# Patient Record
Sex: Female | Born: 1965
Health system: Southern US, Community
[De-identification: ages and names within clinical notes are randomized; demographics above are authoritative.]

## PROBLEM LIST (undated history)

## (undated) DIAGNOSIS — R112 Nausea with vomiting, unspecified: Secondary | ICD-10-CM

## (undated) DIAGNOSIS — Z9889 Other specified postprocedural states: Secondary | ICD-10-CM

## (undated) DIAGNOSIS — D649 Anemia, unspecified: Secondary | ICD-10-CM

## (undated) HISTORY — PX: TONSILLECTOMY: SUR1361

## (undated) HISTORY — PX: ADENOIDECTOMY: SUR15

## (undated) HISTORY — PX: TUBAL LIGATION: SHX77

---

## 2001-06-28 ENCOUNTER — Ambulatory Visit (HOSPITAL_COMMUNITY): Admission: RE | Admit: 2001-06-28 | Discharge: 2001-06-28 | Payer: Self-pay | Admitting: Pulmonary Disease

## 2004-11-11 ENCOUNTER — Ambulatory Visit (HOSPITAL_COMMUNITY): Admission: RE | Admit: 2004-11-11 | Discharge: 2004-11-11 | Payer: Self-pay | Admitting: Specialist

## 2004-11-19 ENCOUNTER — Encounter: Admission: RE | Admit: 2004-11-19 | Discharge: 2004-12-23 | Payer: Self-pay | Admitting: Specialist

## 2006-08-31 ENCOUNTER — Encounter: Admission: RE | Admit: 2006-08-31 | Discharge: 2006-08-31 | Payer: Self-pay | Admitting: Unknown Physician Specialty

## 2007-05-06 ENCOUNTER — Ambulatory Visit (HOSPITAL_COMMUNITY): Admission: RE | Admit: 2007-05-06 | Discharge: 2007-05-06 | Payer: Self-pay | Admitting: Pulmonary Disease

## 2007-09-02 ENCOUNTER — Ambulatory Visit (HOSPITAL_COMMUNITY): Admission: RE | Admit: 2007-09-02 | Discharge: 2007-09-02 | Payer: Self-pay | Admitting: Unknown Physician Specialty

## 2007-10-12 ENCOUNTER — Emergency Department (HOSPITAL_COMMUNITY): Admission: EM | Admit: 2007-10-12 | Discharge: 2007-10-12 | Payer: Self-pay | Admitting: Family Medicine

## 2008-09-18 ENCOUNTER — Ambulatory Visit (HOSPITAL_COMMUNITY): Admission: RE | Admit: 2008-09-18 | Discharge: 2008-09-18 | Payer: Self-pay | Admitting: Unknown Physician Specialty

## 2008-09-28 ENCOUNTER — Emergency Department (HOSPITAL_COMMUNITY): Admission: EM | Admit: 2008-09-28 | Discharge: 2008-09-28 | Payer: Self-pay | Admitting: Emergency Medicine

## 2009-09-06 ENCOUNTER — Other Ambulatory Visit: Admission: RE | Admit: 2009-09-06 | Discharge: 2009-09-06 | Payer: Self-pay | Admitting: Family Medicine

## 2009-11-12 ENCOUNTER — Ambulatory Visit (HOSPITAL_COMMUNITY): Admission: RE | Admit: 2009-11-12 | Discharge: 2009-11-12 | Payer: Self-pay | Admitting: Family Medicine

## 2010-11-29 ENCOUNTER — Encounter: Payer: Self-pay | Admitting: Unknown Physician Specialty

## 2012-03-30 ENCOUNTER — Other Ambulatory Visit (HOSPITAL_COMMUNITY)
Admission: RE | Admit: 2012-03-30 | Discharge: 2012-03-30 | Disposition: A | Discharge status: Home / Self Care | Payer: 59 | Source: Ambulatory Visit | Attending: Family Medicine | Admitting: Family Medicine

## 2012-03-30 DIAGNOSIS — Z124 Encounter for screening for malignant neoplasm of cervix: Secondary | ICD-10-CM | POA: Insufficient documentation

## 2012-03-30 DIAGNOSIS — Z1159 Encounter for screening for other viral diseases: Secondary | ICD-10-CM | POA: Insufficient documentation

## 2012-04-25 ENCOUNTER — Other Ambulatory Visit (HOSPITAL_COMMUNITY): Payer: Self-pay | Admitting: Family Medicine

## 2012-04-25 DIAGNOSIS — Z139 Encounter for screening, unspecified: Secondary | ICD-10-CM

## 2012-05-02 ENCOUNTER — Ambulatory Visit (HOSPITAL_COMMUNITY)
Admission: RE | Admit: 2012-05-02 | Discharge: 2012-05-02 | Disposition: A | Discharge status: Home / Self Care | Payer: 59 | Source: Ambulatory Visit | Attending: Family Medicine | Admitting: Family Medicine

## 2012-05-02 DIAGNOSIS — Z139 Encounter for screening, unspecified: Secondary | ICD-10-CM

## 2012-05-02 DIAGNOSIS — Z1231 Encounter for screening mammogram for malignant neoplasm of breast: Secondary | ICD-10-CM | POA: Insufficient documentation

## 2015-02-27 ENCOUNTER — Other Ambulatory Visit: Payer: Self-pay | Admitting: Family Medicine

## 2015-02-27 ENCOUNTER — Other Ambulatory Visit (HOSPITAL_COMMUNITY): Payer: Self-pay | Admitting: Family Medicine

## 2015-02-27 ENCOUNTER — Other Ambulatory Visit (HOSPITAL_COMMUNITY)
Admission: RE | Admit: 2015-02-27 | Discharge: 2015-02-27 | Disposition: A | Discharge status: Home / Self Care | Payer: 59 | Source: Ambulatory Visit | Attending: Family Medicine | Admitting: Family Medicine

## 2015-02-27 DIAGNOSIS — Z1151 Encounter for screening for human papillomavirus (HPV): Secondary | ICD-10-CM | POA: Diagnosis present

## 2015-02-27 DIAGNOSIS — Z124 Encounter for screening for malignant neoplasm of cervix: Secondary | ICD-10-CM | POA: Diagnosis present

## 2015-02-27 DIAGNOSIS — Z1231 Encounter for screening mammogram for malignant neoplasm of breast: Secondary | ICD-10-CM

## 2015-02-28 LAB — CYTOLOGY - PAP

## 2015-03-20 ENCOUNTER — Ambulatory Visit (HOSPITAL_COMMUNITY)
Admission: RE | Admit: 2015-03-20 | Discharge: 2015-03-20 | Disposition: A | Discharge status: Home / Self Care | Payer: 59 | Source: Ambulatory Visit | Attending: Family Medicine | Admitting: Family Medicine

## 2015-03-20 DIAGNOSIS — Z1231 Encounter for screening mammogram for malignant neoplasm of breast: Secondary | ICD-10-CM

## 2015-04-04 ENCOUNTER — Encounter (HOSPITAL_COMMUNITY): Payer: Self-pay

## 2015-04-04 ENCOUNTER — Emergency Department (HOSPITAL_COMMUNITY)
Admission: EM | Admit: 2015-04-04 | Discharge: 2015-04-04 | Disposition: A | Discharge status: Home / Self Care | Payer: 59 | Attending: Emergency Medicine | Admitting: Emergency Medicine

## 2015-04-04 ENCOUNTER — Emergency Department (HOSPITAL_COMMUNITY): Payer: 59

## 2015-04-04 DIAGNOSIS — M549 Dorsalgia, unspecified: Secondary | ICD-10-CM | POA: Diagnosis present

## 2015-04-04 DIAGNOSIS — R52 Pain, unspecified: Secondary | ICD-10-CM

## 2015-04-04 DIAGNOSIS — R1031 Right lower quadrant pain: Secondary | ICD-10-CM | POA: Diagnosis not present

## 2015-04-04 LAB — URINALYSIS, ROUTINE W REFLEX MICROSCOPIC
Bilirubin Urine: NEGATIVE
Glucose, UA: NEGATIVE mg/dL
HGB URINE DIPSTICK: NEGATIVE
KETONES UR: NEGATIVE mg/dL
Leukocytes, UA: NEGATIVE
NITRITE: NEGATIVE
PH: 5.5 (ref 5.0–8.0)
PROTEIN: NEGATIVE mg/dL
SPECIFIC GRAVITY, URINE: 1.025 (ref 1.005–1.030)
Urobilinogen, UA: 0.2 mg/dL (ref 0.0–1.0)

## 2015-04-04 LAB — COMPREHENSIVE METABOLIC PANEL
ALK PHOS: 105 U/L (ref 38–126)
ALT: 17 U/L (ref 14–54)
AST: 21 U/L (ref 15–41)
Albumin: 4.1 g/dL (ref 3.5–5.0)
Anion gap: 10 (ref 5–15)
BUN: 15 mg/dL (ref 6–20)
CHLORIDE: 104 mmol/L (ref 101–111)
CO2: 25 mmol/L (ref 22–32)
CREATININE: 0.81 mg/dL (ref 0.44–1.00)
Calcium: 9 mg/dL (ref 8.9–10.3)
GFR calc non Af Amer: >60 mL/min (ref >60)
GLUCOSE: 91 mg/dL (ref 65–99)
POTASSIUM: 3.6 mmol/L (ref 3.5–5.1)
Sodium: 139 mmol/L (ref 135–145)
Total Bilirubin: 0.5 mg/dL (ref 0.3–1.2)
Total Protein: 7.6 g/dL (ref 6.5–8.1)

## 2015-04-04 LAB — CBC WITH DIFFERENTIAL/PLATELET
BASOS ABS: 0 10*3/uL (ref 0.0–0.1)
BASOS PCT: 0 % (ref 0–1)
EOS ABS: 0.1 10*3/uL (ref 0.0–0.7)
Eosinophils Relative: 2 % (ref 0–5)
HCT: 38.9 % (ref 36.0–46.0)
Hemoglobin: 12.4 g/dL (ref 12.0–15.0)
Lymphocytes Relative: 31 % (ref 12–46)
Lymphs Abs: 1.7 10*3/uL (ref 0.7–4.0)
MCH: 28 pg (ref 26.0–34.0)
MCHC: 31.9 g/dL (ref 30.0–36.0)
MCV: 87.8 fL (ref 78.0–100.0)
MONO ABS: 0.4 10*3/uL (ref 0.1–1.0)
Monocytes Relative: 6 % (ref 3–12)
NEUTROS ABS: 3.3 10*3/uL (ref 1.7–7.7)
NEUTROS PCT: 61 % (ref 43–77)
Platelets: 243 10*3/uL (ref 150–400)
RBC: 4.43 MIL/uL (ref 3.87–5.11)
RDW: 13.6 % (ref 11.5–15.5)
WBC: 5.5 10*3/uL (ref 4.0–10.5)

## 2015-04-04 MED ORDER — OXYCODONE-ACETAMINOPHEN 5-325 MG PO TABS: 1.0000 | ORAL_TABLET | Freq: Four times a day (QID) | ORAL | Status: DC | PRN

## 2015-04-04 MED ORDER — NAPROXEN 500 MG PO TABS: 500.0000 mg | ORAL_TABLET | Freq: Two times a day (BID) | ORAL | Status: DC

## 2015-04-04 MED ORDER — HYDROMORPHONE HCL 1 MG/ML IJ SOLN
1.0000 mg | Freq: Once | INTRAMUSCULAR | Status: AC
  Administered 2015-04-04: 1 mg via INTRAVENOUS
  Filled 2015-04-04: qty 1

## 2015-04-04 MED ORDER — ONDANSETRON HCL 4 MG/2ML IJ SOLN
4.0000 mg | Freq: Once | INTRAMUSCULAR | Status: AC
  Administered 2015-04-04: 4 mg via INTRAVENOUS
  Filled 2015-04-04: qty 2

## 2015-04-04 NOTE — Discharge Instructions (Signed)
Follow up with your md next week to check your back and bp

## 2015-04-04 NOTE — ED Notes (Signed)
Pt c/o r flank pain x 4 days.  Denies any pain or burning with urination.  Denies any injury.

## 2015-04-04 NOTE — ED Provider Notes (Addendum)
CSN: [id number]     Arrival date & time 04/04/15  0807 History  This chart was scribed for Milton Ferguson, MD by Girtha Hake, ED Scribe. The patient was seen in [facility]. The patient's care was started at 8:25 AM.     Chief Complaint  Patient presents with  . Back Pain   Patient is a 49 y.o. female presenting with back pain and flank pain. The history is provided by the patient. No language interpreter was used.  Back Pain Radiates to:  Does not radiate Associated symptoms: no abdominal pain, no chest pain, no dysuria and no headaches   Flank Pain This is a new problem. The current episode started more than 2 days ago (4 days). The problem occurs constantly. The problem has not changed since onset.Pertinent negatives include no chest pain, no abdominal pain and no headaches. Nothing aggravates the symptoms. Nothing relieves the symptoms. She has tried nothing for the symptoms.   HPI Comments: Kristen Humphrey is a 49 y.o. female who presents to the Emergency Department complaining of constant right flank pain beginning 4-5 days ago. She states that the pain is not worsened by movement. She reports that she has taken Flexeril without relief of the pain. Patient denies fever, chills, vomiting, dysuria, or recent back injury.   History reviewed. No pertinent past medical history. Past Surgical History  Procedure Laterality Date  . Tubal ligation    . Tonsillectomy    . Adenoidectomy     No family history on file. History  Substance Use Topics  . Smoking status: Never Smoker   . Smokeless tobacco: Not on file  . Alcohol Use: Yes     Comment: occ   OB History    No data available     Review of Systems  Constitutional: Negative for chills, appetite change and fatigue.  HENT: Negative for congestion, ear discharge and sinus pressure.   Eyes: Negative for discharge.  Respiratory: Negative for cough.   Cardiovascular: Negative for chest pain.  Gastrointestinal: Negative for  vomiting, abdominal pain and diarrhea.  Genitourinary: Positive for flank pain. Negative for dysuria, frequency and hematuria.  Musculoskeletal: Positive for back pain.  Skin: Negative for rash.  Neurological: Negative for seizures and headaches.  Psychiatric/Behavioral: Negative for hallucinations.      Allergies  Sulfa antibiotics  Home Medications   Prior to Admission medications   Not on File   Triage Vitals: BP 177/94 mmHg  Pulse 71  Temp(Src) 97.7 F (36.5 C) (Oral)  Resp 20  Ht 5\' 4"  (1.626 m)  Wt 165 lb (74.844 kg)  BMI 28.31 kg/m2  SpO2 100% Physical Exam  Constitutional: She is oriented to person, place, and time. She appears well-developed.  HENT:  Head: Normocephalic.  Eyes: Conjunctivae and EOM are normal. No scleral icterus.  Neck: Neck supple. No thyromegaly present.  Cardiovascular: Normal rate and regular rhythm.  Exam reveals no gallop and no friction rub.   No murmur heard. Pulmonary/Chest: No stridor. She has no wheezes. She has no rales. She exhibits no tenderness.  Abdominal: She exhibits no distension. There is no tenderness. There is no rebound.  Musculoskeletal: Normal range of motion. She exhibits tenderness. She exhibits no edema.  Moderately tender to right flank area.  Lymphadenopathy:    She has no cervical adenopathy.  Neurological: She is oriented to person, place, and time. She exhibits normal muscle tone. Coordination normal.  Skin: No rash noted. No erythema.  Psychiatric: She has a normal  mood and affect. Her behavior is normal.  Nursing note and vitals reviewed.   ED Course  Procedures (including critical care time) DIAGNOSTIC STUDIES: Oxygen Saturation is 100% on room air, normal by my interpretation.    COORDINATION OF CARE:    Labs Review Labs Reviewed  URINALYSIS, ROUTINE W REFLEX MICROSCOPIC (NOT AT [facility])    Imaging Review No results found.   EKG Interpretation None      MDM   Final diagnoses:  None     Back pain,  Muscle strain  Milton Ferguson, MD 04/04/15 1115    The chart was scribed for me under my direct supervision.  I personally performed the history, physical, and medical decision making and all procedures in the evaluation of this patient.Milton Ferguson, MD 04/04/15 1115

## 2016-05-06 ENCOUNTER — Other Ambulatory Visit (HOSPITAL_COMMUNITY): Payer: Self-pay | Admitting: Family Medicine

## 2016-05-06 DIAGNOSIS — Z1231 Encounter for screening mammogram for malignant neoplasm of breast: Secondary | ICD-10-CM

## 2016-05-13 ENCOUNTER — Ambulatory Visit (HOSPITAL_COMMUNITY)
Admission: RE | Admit: 2016-05-13 | Discharge: 2016-05-13 | Disposition: A | Discharge status: Home / Self Care | Payer: 59 | Source: Ambulatory Visit | Attending: Family Medicine | Admitting: Family Medicine

## 2016-05-13 DIAGNOSIS — Z1231 Encounter for screening mammogram for malignant neoplasm of breast: Secondary | ICD-10-CM

## 2016-05-20 ENCOUNTER — Encounter (INDEPENDENT_AMBULATORY_CARE_PROVIDER_SITE_OTHER): Payer: Self-pay | Admitting: *Deleted

## 2016-06-18 ENCOUNTER — Other Ambulatory Visit (INDEPENDENT_AMBULATORY_CARE_PROVIDER_SITE_OTHER): Payer: Self-pay | Admitting: *Deleted

## 2016-06-18 ENCOUNTER — Encounter (INDEPENDENT_AMBULATORY_CARE_PROVIDER_SITE_OTHER): Payer: Self-pay | Admitting: *Deleted

## 2016-06-18 DIAGNOSIS — Z1211 Encounter for screening for malignant neoplasm of colon: Secondary | ICD-10-CM | POA: Insufficient documentation

## 2016-09-10 ENCOUNTER — Encounter (INDEPENDENT_AMBULATORY_CARE_PROVIDER_SITE_OTHER): Payer: Self-pay | Admitting: *Deleted

## 2016-09-10 ENCOUNTER — Telehealth (INDEPENDENT_AMBULATORY_CARE_PROVIDER_SITE_OTHER): Payer: Self-pay | Admitting: *Deleted

## 2016-09-10 MED ORDER — PEG 3350-KCL-NA BICARB-NACL 420 G PO SOLR: 4000.0000 mL | Freq: Once | ORAL | 0 refills | Status: AC

## 2016-09-10 NOTE — Telephone Encounter (Signed)
Patient needs trilyte 

## 2016-09-17 ENCOUNTER — Telehealth (INDEPENDENT_AMBULATORY_CARE_PROVIDER_SITE_OTHER): Payer: Self-pay | Admitting: *Deleted

## 2016-09-17 NOTE — Telephone Encounter (Signed)
agree

## 2016-09-17 NOTE — Telephone Encounter (Signed)
Referring MD/PCP: rankins   Procedure: tcs  Reason/Indication:  screening  Has patient had this procedure before?  no  If so, when, by whom and where?    Is there a family history of colon cancer?  no  Who?  What age when diagnosed?    Is patient diabetic?   no      Does patient have prosthetic heart valve or mechanical valve?  no  Do you have a pacemaker?  no  Has patient ever had endocarditis? no  Has patient had joint replacement within last 12 months?  no  Does patient tend to be constipated or take laxatives? no  Does patient have a history of alcohol/drug use?  no  Is patient on Coumadin, Plavix and/or Aspirin? no  Medications: atorvastatin 20 mg daily  Allergies: sulfur  Medication Adjustment:   Procedure date & time: 10/14/16 at 930

## 2016-10-14 ENCOUNTER — Ambulatory Visit (HOSPITAL_COMMUNITY)
Admission: RE | Admit: 2016-10-14 | Discharge: 2016-10-14 | Disposition: A | Discharge status: Home / Self Care | Payer: 59 | Source: Ambulatory Visit | Attending: Internal Medicine | Admitting: Internal Medicine

## 2016-10-14 ENCOUNTER — Encounter (HOSPITAL_COMMUNITY)
Admission: RE | Disposition: A | Discharge status: Home / Self Care | Payer: Self-pay | Source: Ambulatory Visit | Attending: Internal Medicine

## 2016-10-14 ENCOUNTER — Encounter (HOSPITAL_COMMUNITY): Payer: Self-pay

## 2016-10-14 DIAGNOSIS — Z1211 Encounter for screening for malignant neoplasm of colon: Secondary | ICD-10-CM | POA: Insufficient documentation

## 2016-10-14 DIAGNOSIS — Z79899 Other long term (current) drug therapy: Secondary | ICD-10-CM | POA: Diagnosis not present

## 2016-10-14 DIAGNOSIS — K644 Residual hemorrhoidal skin tags: Secondary | ICD-10-CM | POA: Diagnosis not present

## 2016-10-14 DIAGNOSIS — D125 Benign neoplasm of sigmoid colon: Secondary | ICD-10-CM | POA: Insufficient documentation

## 2016-10-14 HISTORY — DX: Nausea with vomiting, unspecified: R11.2

## 2016-10-14 HISTORY — DX: Anemia, unspecified: D64.9

## 2016-10-14 HISTORY — PX: POLYPECTOMY: SHX5525

## 2016-10-14 HISTORY — DX: Other specified postprocedural states: Z98.890

## 2016-10-14 HISTORY — PX: COLONOSCOPY: SHX5424

## 2016-10-14 SURGERY — COLONOSCOPY
Anesthesia: Moderate Sedation

## 2016-10-14 MED ORDER — MIDAZOLAM HCL 5 MG/5ML IJ SOLN
INTRAMUSCULAR | Status: AC
  Filled 2016-10-14: qty 10

## 2016-10-14 MED ORDER — MEPERIDINE HCL 50 MG/ML IJ SOLN
INTRAMUSCULAR | Status: DC | PRN
  Administered 2016-10-14 (×2): 25 mg via INTRAVENOUS

## 2016-10-14 MED ORDER — MEPERIDINE HCL 50 MG/ML IJ SOLN
INTRAMUSCULAR | Status: AC
  Filled 2016-10-14: qty 1

## 2016-10-14 MED ORDER — SODIUM CHLORIDE 0.9 % IV SOLN
INTRAVENOUS | Status: DC
  Administered 2016-10-14: 09:00:00 via INTRAVENOUS

## 2016-10-14 MED ORDER — STERILE WATER FOR IRRIGATION IR SOLN
Status: DC | PRN
  Administered 2016-10-14: 09:00:00

## 2016-10-14 MED ORDER — MIDAZOLAM HCL 5 MG/5ML IJ SOLN
INTRAMUSCULAR | Status: DC | PRN
  Administered 2016-10-14 (×4): 2 mg via INTRAVENOUS

## 2016-10-14 NOTE — Discharge Instructions (Signed)
Resume usual medications and diet. No driving for 24 hours. Physician will call with biopsy results.   Colonoscopy, Adult, Care After This sheet gives you information about how to care for yourself after your procedure. Your health care provider may also give you more specific instructions. If you have problems or questions, contact your health care provider. What can I expect after the procedure? After the procedure, it is common to have:  A small amount of blood in your stool for 24 hours after the procedure.  Some gas.  Mild abdominal cramping or bloating. Follow these instructions at home: General instructions  For the first 24 hours after the procedure:  Do not drive or use machinery.  Do not sign important documents.  Do not drink alcohol.  Do your regular daily activities at a slower pace than normal.  Eat soft, easy-to-digest foods.  Rest often.  Take over-the-counter or prescription medicines only as told by your health care provider.  It is up to you to get the results of your procedure. Ask your health care provider, or the department performing the procedure, when your results will be ready. Relieving cramping and bloating  Try walking around when you have cramps or feel bloated.  Apply heat to your abdomen as told by your health care provider. Use a heat source that your health care provider recommends, such as a moist heat pack or a heating pad.  Place a towel between your skin and the heat source.  Leave the heat on for 20-30 minutes.  Remove the heat if your skin turns bright red. This is especially important if you are unable to feel pain, heat, or cold. You may have a greater risk of getting burned. Eating and drinking  Drink enough fluid to keep your urine clear or pale yellow.  Resume your normal diet as instructed by your health care provider. Avoid heavy or fried foods that are hard to digest.  Avoid drinking alcohol for as long as instructed  by your health care provider. Contact a health care provider if:  You have blood in your stool 2-3 days after the procedure. Get help right away if:  You have more than a small spotting of blood in your stool.  You pass large blood clots in your stool.  Your abdomen is swollen.  You have nausea or vomiting.  You have a fever.  You have increasing abdominal pain that is not relieved with medicine. This information is not intended to replace advice given to you by your health care provider. Make sure you discuss any questions you have with your health care provider. Document Released: 06/09/2004 Document Revised: 07/20/2016 Document Reviewed: 01/07/2016 Elsevier Interactive Patient Education  2017 [employer]    Hemorrhoids Hemorrhoids are swollen veins in and around the rectum or anus. There are two types of hemorrhoids:  Internal hemorrhoids. These occur in the veins that are just inside the rectum. They may poke through to the outside and become irritated and painful.  External hemorrhoids. These occur in the veins that are outside of the anus and can be felt as a painful swelling or hard lump near the anus. Most hemorrhoids do not cause serious problems, and they can be managed with home treatments such as diet and lifestyle changes. If home treatments do not help your symptoms, procedures can be done to shrink or remove the hemorrhoids. What are the causes? This condition is caused by increased pressure in the anal area. This pressure may result from  various things, including:  Constipation.  Straining to have a bowel movement.  Diarrhea.  Pregnancy.  Obesity.  Sitting for long periods of time.  Heavy lifting or other activity that causes you to strain.  Anal sex. What are the signs or symptoms? Symptoms of this condition include:  Pain.  Anal itching or irritation.  Rectal bleeding.  Leakage of stool (feces).  Anal swelling.  One or more lumps  around the anus. How is this diagnosed? This condition can often be diagnosed through a visual exam. Other exams or tests may also be done, such as:  Examination of the rectal area with a gloved hand (digital rectal exam).  Examination of the anal canal using a small tube (anoscope).  A blood test, if you have lost a significant amount of blood.  A test to look inside the colon (sigmoidoscopy or colonoscopy). How is this treated? This condition can usually be treated at home. However, various procedures may be done if dietary changes, lifestyle changes, and other home treatments do not help your symptoms. These procedures can help make the hemorrhoids smaller or remove them completely. Some of these procedures involve surgery, and others do not. Common procedures include:  Rubber band ligation. Rubber bands are placed at the base of the hemorrhoids to cut off the blood supply to them.  Sclerotherapy. Medicine is injected into the hemorrhoids to shrink them.  Infrared coagulation. A type of light energy is used to get rid of the hemorrhoids.  Hemorrhoidectomy surgery. The hemorrhoids are surgically removed, and the veins that supply them are tied off.  Stapled hemorrhoidopexy surgery. A circular stapling device is used to remove the hemorrhoids and use staples to cut off the blood supply to them. Follow these instructions at home: Eating and drinking  Eat foods that have a lot of fiber in them, such as whole grains, beans, nuts, fruits, and vegetables. Ask your health care provider about taking products that have added fiber (fiber supplements).  Drink enough fluid to keep your urine clear or pale yellow. Managing pain and swelling  Take warm sitz baths for 20 minutes, 3-4 times a day to ease pain and discomfort.  If directed, apply ice to the affected area. Using ice packs between sitz baths may be helpful.  Put ice in a plastic bag.  Place a towel between your skin and the  bag.  Leave the ice on for 20 minutes, 2-3 times a day. General instructions  Take over-the-counter and prescription medicines only as told by your health care provider.  Use medicated creams or suppositories as told.  Exercise regularly.  Go to the bathroom when you have the urge to have a bowel movement. Do not wait.  Avoid straining to have bowel movements.  Keep the anal area dry and clean. Use wet toilet paper or moist towelettes after a bowel movement.  Do not sit on the toilet for long periods of time. This increases blood pooling and pain. Contact a health care provider if:  You have increasing pain and swelling that are not controlled by treatment or medicine.  You have uncontrolled bleeding.  You have difficulty having a bowel movement, or you are unable to have a bowel movement.  You have pain or inflammation outside the area of the hemorrhoids. This information is not intended to replace advice given to you by your health care provider. Make sure you discuss any questions you have with your health care provider. Document Released: 10/23/2000 Document Revised: 03/25/2016 Document  Reviewed: 07/10/2015 Elsevier Interactive Patient Education  2017 [employer].    Colon Polyps Introduction Polyps are tissue growths inside the body. Polyps can grow in many places, including the large intestine (colon). A polyp may be a round bump or a mushroom-shaped growth. You could have one polyp or several. Most colon polyps are noncancerous (benign). However, some colon polyps can become cancerous over time. What are the causes? The exact cause of colon polyps is not known. What increases the risk? This condition is more likely to develop in people who:  Have a family history of colon cancer or colon polyps.  Are older than 50 or older than 45 if they are African American.  Have inflammatory bowel disease, such as ulcerative colitis or Crohn disease.  Are  overweight.  Smoke cigarettes.  Do not get enough exercise.  Drink too much alcohol.  Eat a diet that is:  High in fat and red meat.  Low in fiber.  Had childhood cancer that was treated with abdominal radiation. What are the signs or symptoms? Most polyps do not cause symptoms. If you have symptoms, they may include:  Blood coming from your rectum when having a bowel movement.  Blood in your stool.The stool may look dark red or black.  A change in bowel habits, such as constipation or diarrhea. How is this diagnosed? This condition is diagnosed with a colonoscopy. This is a procedure that uses a lighted, flexible scope to look at the inside of your colon. How is this treated? Treatment for this condition involves removing any polyps that are found. Those polyps will then be tested for cancer. If cancer is found, your health care provider will talk to you about options for colon cancer treatment. Follow these instructions at home: Diet  Eat plenty of fiber, such as fruits, vegetables, and whole grains.  Eat foods that are high in calcium and vitamin D, such as milk, cheese, yogurt, eggs, liver, fish, and broccoli.  Limit foods high in fat, red meats, and processed meats, such as hot dogs, sausage, bacon, and lunch meats.  Maintain a healthy weight, or lose weight if recommended by your health care provider. General instructions  Do not smoke cigarettes.  Do not drink alcohol excessively.  Keep all follow-up visits as told by your health care provider. This is important. This includes keeping regularly scheduled colonoscopies. Talk to your health care provider about when you need a colonoscopy.  Exercise every day or as told by your health care provider. Contact a health care provider if:  You have new or worsening bleeding during a bowel movement.  You have new or increased blood in your stool.  You have a change in bowel habits.  You unexpectedly lose  weight. This information is not intended to replace advice given to you by your health care provider. Make sure you discuss any questions you have with your health care provider. Document Released: 07/22/2004 Document Revised: 04/02/2016 Document Reviewed: 09/16/2015  2017 Elsevier

## 2016-10-14 NOTE — Op Note (Signed)
[facility] Patient Name: Kristen Humphrey Procedure Date: 10/14/2016 8:57 AM MRN: [id number] Date of Birth: 04/17/1966 Attending MD: Hildred Laser , MD CSN: [id number] Age: 50 Admit Type: Outpatient Procedure:                Colonoscopy Indications:              Screening for colorectal malignant neoplasm Providers:                Hildred Laser, MD, Otis Peak B. Sharon Seller, RN, Randa Spike, Technician Referring MD:             Bill Salinas. Rankins, MD Medicines:                Meperidine 50 mg IV, Midazolam 8 mg IV Complications:            No immediate complications. Estimated Blood Loss:     Estimated blood loss was minimal. Procedure:                Pre-Anesthesia Assessment:                           - Prior to the procedure, a History and Physical                            was performed, and patient medications and                            allergies were reviewed. The patient's tolerance of                            previous anesthesia was also reviewed. The risks                            and benefits of the procedure and the sedation                            options and risks were discussed with the patient.                            All questions were answered, and informed consent                            was obtained. Prior Anticoagulants: The patient                            last took ibuprofen 7 days prior to the procedure.                            ASA Grade Assessment: I - A normal, healthy                            patient. After reviewing the risks and benefits,  the patient was deemed in satisfactory condition to                            undergo the procedure.                           After obtaining informed consent, the colonoscope                            was passed under direct vision. Throughout the                            procedure, the patient's blood pressure, pulse, and               oxygen saturations were monitored continuously. The                            [id number]) scope was introduced through                            the anus and advanced to the the cecum, identified                            by appendiceal orifice and ileocecal valve. The                            colonoscopy was performed without difficulty. The                            patient tolerated the procedure well. The quality                            of the bowel preparation was excellent. The                            ileocecal valve, appendiceal orifice, and rectum                            were photographed. Scope In: 9:15:21 AM Scope Out: 9:30:54 AM Scope Withdrawal Time: 0 hours 7 minutes 37 seconds  Total Procedure Duration: 0 hours 15 minutes 33 seconds  Findings:      The perianal and digital rectal examinations were normal.      A 4 mm polyp was found in the proximal sigmoid colon. The polyp was       sessile. The polyp was removed with a cold snare. Resection and       retrieval were complete.      External hemorrhoids were found during retroflexion. The hemorrhoids       were small. Impression:               - One 4 mm polyp in the proximal sigmoid colon,                            removed with a cold snare. Resected and retrieved.                           -  External hemorrhoids. Moderate Sedation:      Moderate (conscious) sedation was administered by the endoscopy nurse       and supervised by the endoscopist. The following parameters were       monitored: oxygen saturation, heart rate, blood pressure, CO2       capnography and response to care. Total physician intraservice time was       20 minutes. Recommendation:           - Patient has a contact number available for                            emergencies. The signs and symptoms of potential                            delayed complications were discussed with the                             patient. Return to normal activities tomorrow.                            Written discharge instructions were provided to the                            patient.                           - Resume previous diet today.                           - Continue present medications.                           - Await pathology results.                           - Repeat colonoscopy for surveillance based on                            pathology results. Procedure Code(s):        --- Professional ---                           [phone], Colonoscopy, flexible; with removal of                            tumor(s), polyp(s), or other lesion(s) by snare                            technique                           99152, Moderate sedation services provided by the                            same physician or other qualified health care  professional performing the diagnostic or                            therapeutic service that the sedation supports,                            requiring the presence of an independent trained                            observer to assist in the monitoring of the                            patient's level of consciousness and physiological                            status; initial 15 minutes of intraservice time,                            patient age 59 years or older Diagnosis Code(s):        --- Professional ---                           Z12.11, Encounter for screening for malignant                            neoplasm of colon                           D12.5, Benign neoplasm of sigmoid colon                           K64.4, Residual hemorrhoidal skin tags CPT copyright 2016 [employer]. All rights reserved. The codes documented in this report are preliminary and upon coder review may  be revised to meet current compliance requirements. Hildred Laser, MD Hildred Laser, MD 10/14/2016 9:36:41 AM This report has been signed  electronically. Number of Addenda: 0

## 2016-10-14 NOTE — H&P (Signed)
Kristen Humphrey is an 50 y.o. female.   Chief Complaint: Patient is here for colonoscopy. HPI: Patient is 50 year old Caucasian female who is in for screening colonoscopy. She denies abdominal pain change in bowel habits or rectal bleeding. Family history is negative for CRC.  Past Medical History:  Diagnosis Date  . Anemia   . PONV (postoperative nausea and vomiting)     Past Surgical History:  Procedure Laterality Date  . ADENOIDECTOMY    . TONSILLECTOMY    . TUBAL LIGATION      History reviewed. No pertinent family history. Social History:  reports that she has never smoked. She has never used smokeless tobacco. She reports that she drinks alcohol. She reports that she does not use drugs.  Allergies:  Allergies  Allergen Reactions  . Sulfa Antibiotics Other (See Comments)    Stomatitis     Medications Prior to Admission  Medication Sig Dispense Refill  . atorvastatin (LIPITOR) 20 MG tablet Take 20 mg by mouth daily.    Marland Kitchen ibuprofen (ADVIL,MOTRIN) 200 MG tablet Take 400 mg by mouth every 6 (six) hours as needed (for pain/inflammation).       No results found for this or any previous visit (from the past 48 hour(s)). No results found.  ROS  Blood pressure 138/76, pulse 76, temperature 97.9 F (36.6 C), temperature source Oral, resp. rate 12, height 5\' 4"  (1.626 m), weight 154 lb (69.9 kg), SpO2 98 %. Physical Exam  Constitutional: She appears well-developed and well-nourished.  HENT:  Mouth/Throat: Oropharynx is clear and moist.  Eyes: Conjunctivae are normal. No scleral icterus.  Neck: No thyromegaly present.  Cardiovascular: Normal rate, regular rhythm and normal heart sounds.   No murmur heard. Respiratory: Effort normal and breath sounds normal.  GI: Soft. She exhibits no distension and no mass. There is no tenderness.  Musculoskeletal: She exhibits no edema.  Lymphadenopathy:    She has no cervical adenopathy.  Neurological: She is alert.  Skin: Skin is warm  and dry.     Assessment/Plan Average risk screening colonoscopy.  Hildred Laser, MD 10/14/2016, 9:04 AM

## 2016-10-16 ENCOUNTER — Encounter (HOSPITAL_COMMUNITY): Payer: Self-pay | Admitting: Internal Medicine

## 2017-01-01 DIAGNOSIS — H1789 Other corneal scars and opacities: Secondary | ICD-10-CM | POA: Diagnosis not present

## 2017-05-18 ENCOUNTER — Other Ambulatory Visit (HOSPITAL_COMMUNITY): Payer: Self-pay | Admitting: Family Medicine

## 2017-05-18 DIAGNOSIS — Z1231 Encounter for screening mammogram for malignant neoplasm of breast: Secondary | ICD-10-CM

## 2017-05-19 DIAGNOSIS — Z0000 Encounter for general adult medical examination without abnormal findings: Secondary | ICD-10-CM | POA: Diagnosis not present

## 2017-05-19 DIAGNOSIS — E7800 Pure hypercholesterolemia, unspecified: Secondary | ICD-10-CM | POA: Diagnosis not present

## 2017-05-26 ENCOUNTER — Ambulatory Visit (HOSPITAL_COMMUNITY)
Admission: RE | Admit: 2017-05-26 | Discharge: 2017-05-26 | Disposition: A | Discharge status: Home / Self Care | Payer: 59 | Source: Ambulatory Visit | Attending: Family Medicine | Admitting: Family Medicine

## 2017-05-26 DIAGNOSIS — Z1231 Encounter for screening mammogram for malignant neoplasm of breast: Secondary | ICD-10-CM | POA: Diagnosis not present

## 2017-06-02 DIAGNOSIS — J019 Acute sinusitis, unspecified: Secondary | ICD-10-CM | POA: Diagnosis not present

## 2017-06-02 DIAGNOSIS — H1045 Other chronic allergic conjunctivitis: Secondary | ICD-10-CM | POA: Diagnosis not present

## 2017-06-02 DIAGNOSIS — J301 Allergic rhinitis due to pollen: Secondary | ICD-10-CM | POA: Diagnosis not present

## 2017-07-21 DIAGNOSIS — Z719 Counseling, unspecified: Secondary | ICD-10-CM | POA: Diagnosis not present

## 2018-03-22 DIAGNOSIS — E7800 Pure hypercholesterolemia, unspecified: Secondary | ICD-10-CM | POA: Diagnosis not present

## 2018-03-30 DIAGNOSIS — E7800 Pure hypercholesterolemia, unspecified: Secondary | ICD-10-CM | POA: Diagnosis not present

## 2018-04-07 DIAGNOSIS — R7309 Other abnormal glucose: Secondary | ICD-10-CM | POA: Diagnosis not present

## 2018-09-27 DIAGNOSIS — J069 Acute upper respiratory infection, unspecified: Secondary | ICD-10-CM | POA: Diagnosis not present

## 2018-12-06 ENCOUNTER — Other Ambulatory Visit (HOSPITAL_COMMUNITY)
Admission: RE | Admit: 2018-12-06 | Discharge: 2018-12-06 | Disposition: A | Discharge status: Home / Self Care | Payer: 59 | Source: Ambulatory Visit | Attending: Family Medicine | Admitting: Family Medicine

## 2018-12-06 ENCOUNTER — Other Ambulatory Visit: Payer: Self-pay | Admitting: Family Medicine

## 2018-12-06 DIAGNOSIS — Z0000 Encounter for general adult medical examination without abnormal findings: Secondary | ICD-10-CM | POA: Diagnosis not present

## 2018-12-06 DIAGNOSIS — Z23 Encounter for immunization: Secondary | ICD-10-CM | POA: Diagnosis not present

## 2018-12-06 DIAGNOSIS — Z124 Encounter for screening for malignant neoplasm of cervix: Secondary | ICD-10-CM | POA: Insufficient documentation

## 2018-12-13 LAB — CYTOLOGY - PAP
Diagnosis: UNDETERMINED — AB
HPV: NOT DETECTED

## 2018-12-14 DIAGNOSIS — E7800 Pure hypercholesterolemia, unspecified: Secondary | ICD-10-CM | POA: Diagnosis not present

## 2018-12-14 DIAGNOSIS — R7309 Other abnormal glucose: Secondary | ICD-10-CM | POA: Diagnosis not present

## 2020-07-05 ENCOUNTER — Other Ambulatory Visit (HOSPITAL_COMMUNITY): Payer: Self-pay | Admitting: Family Medicine

## 2020-07-05 DIAGNOSIS — Z1231 Encounter for screening mammogram for malignant neoplasm of breast: Secondary | ICD-10-CM

## 2020-07-10 ENCOUNTER — Other Ambulatory Visit: Payer: Self-pay

## 2020-07-10 ENCOUNTER — Ambulatory Visit (HOSPITAL_COMMUNITY)
Admission: RE | Admit: 2020-07-10 | Discharge: 2020-07-10 | Disposition: A | Discharge status: Home / Self Care | Payer: 59 | Source: Ambulatory Visit | Attending: Family Medicine | Admitting: Family Medicine

## 2020-07-10 DIAGNOSIS — Z1231 Encounter for screening mammogram for malignant neoplasm of breast: Secondary | ICD-10-CM

## 2020-07-16 ENCOUNTER — Other Ambulatory Visit (HOSPITAL_COMMUNITY): Payer: Self-pay | Admitting: Family Medicine

## 2020-07-16 DIAGNOSIS — R928 Other abnormal and inconclusive findings on diagnostic imaging of breast: Secondary | ICD-10-CM

## 2020-07-29 ENCOUNTER — Ambulatory Visit: Payer: 59

## 2020-07-29 ENCOUNTER — Other Ambulatory Visit: Payer: Self-pay

## 2020-07-29 ENCOUNTER — Ambulatory Visit
Admission: RE | Admit: 2020-07-29 | Discharge: 2020-07-29 | Disposition: A | Discharge status: Home / Self Care | Payer: 59 | Source: Ambulatory Visit | Attending: Family Medicine | Admitting: Family Medicine

## 2020-07-29 DIAGNOSIS — R928 Other abnormal and inconclusive findings on diagnostic imaging of breast: Secondary | ICD-10-CM

## 2021-08-14 ENCOUNTER — Other Ambulatory Visit (HOSPITAL_COMMUNITY): Payer: Self-pay | Admitting: Family Medicine

## 2021-08-14 DIAGNOSIS — Z1231 Encounter for screening mammogram for malignant neoplasm of breast: Secondary | ICD-10-CM

## 2021-08-18 ENCOUNTER — Other Ambulatory Visit: Payer: Self-pay

## 2021-08-18 ENCOUNTER — Ambulatory Visit (HOSPITAL_COMMUNITY)
Admission: RE | Admit: 2021-08-18 | Discharge: 2021-08-18 | Disposition: A | Discharge status: Home / Self Care | Payer: BC Managed Care – PPO | Source: Ambulatory Visit | Attending: Family Medicine | Admitting: Family Medicine

## 2021-08-18 DIAGNOSIS — Z1231 Encounter for screening mammogram for malignant neoplasm of breast: Secondary | ICD-10-CM | POA: Diagnosis not present

## 2021-09-18 DIAGNOSIS — Z23 Encounter for immunization: Secondary | ICD-10-CM | POA: Diagnosis not present

## 2022-01-22 DIAGNOSIS — E7800 Pure hypercholesterolemia, unspecified: Secondary | ICD-10-CM | POA: Diagnosis not present

## 2022-01-22 DIAGNOSIS — R079 Chest pain, unspecified: Secondary | ICD-10-CM | POA: Diagnosis not present

## 2022-01-22 DIAGNOSIS — I10 Essential (primary) hypertension: Secondary | ICD-10-CM | POA: Diagnosis not present

## 2022-02-23 DIAGNOSIS — I10 Essential (primary) hypertension: Secondary | ICD-10-CM | POA: Diagnosis not present

## 2022-02-23 DIAGNOSIS — E7800 Pure hypercholesterolemia, unspecified: Secondary | ICD-10-CM | POA: Diagnosis not present

## 2022-02-23 DIAGNOSIS — Z0000 Encounter for general adult medical examination without abnormal findings: Secondary | ICD-10-CM | POA: Diagnosis not present

## 2022-02-23 DIAGNOSIS — Z5181 Encounter for therapeutic drug level monitoring: Secondary | ICD-10-CM | POA: Diagnosis not present

## 2022-04-03 DIAGNOSIS — I10 Essential (primary) hypertension: Secondary | ICD-10-CM | POA: Diagnosis not present

## 2022-04-03 DIAGNOSIS — Z79899 Other long term (current) drug therapy: Secondary | ICD-10-CM | POA: Diagnosis not present

## 2022-07-03 DIAGNOSIS — F4310 Post-traumatic stress disorder, unspecified: Secondary | ICD-10-CM | POA: Diagnosis not present

## 2022-07-03 DIAGNOSIS — E7800 Pure hypercholesterolemia, unspecified: Secondary | ICD-10-CM | POA: Diagnosis not present

## 2022-07-03 DIAGNOSIS — I10 Essential (primary) hypertension: Secondary | ICD-10-CM | POA: Diagnosis not present

## 2022-10-05 DIAGNOSIS — R059 Cough, unspecified: Secondary | ICD-10-CM | POA: Diagnosis not present

## 2022-12-25 DIAGNOSIS — E7800 Pure hypercholesterolemia, unspecified: Secondary | ICD-10-CM | POA: Diagnosis not present

## 2022-12-25 DIAGNOSIS — E663 Overweight: Secondary | ICD-10-CM | POA: Diagnosis not present

## 2022-12-25 DIAGNOSIS — I10 Essential (primary) hypertension: Secondary | ICD-10-CM | POA: Diagnosis not present

## 2022-12-25 DIAGNOSIS — Z79899 Other long term (current) drug therapy: Secondary | ICD-10-CM | POA: Diagnosis not present

## 2022-12-25 DIAGNOSIS — Z23 Encounter for immunization: Secondary | ICD-10-CM | POA: Diagnosis not present

## 2023-01-01 ENCOUNTER — Encounter (HOSPITAL_COMMUNITY): Payer: BC Managed Care – PPO

## 2023-01-01 ENCOUNTER — Ambulatory Visit (HOSPITAL_COMMUNITY)
Admission: RE | Admit: 2023-01-01 | Discharge: 2023-01-01 | Disposition: A | Discharge status: Home / Self Care | Payer: BC Managed Care – PPO | Source: Ambulatory Visit | Attending: Family Medicine | Admitting: Family Medicine

## 2023-01-01 ENCOUNTER — Other Ambulatory Visit (HOSPITAL_COMMUNITY): Payer: Self-pay | Admitting: Family Medicine

## 2023-01-01 DIAGNOSIS — Z1231 Encounter for screening mammogram for malignant neoplasm of breast: Secondary | ICD-10-CM

## 2023-02-15 ENCOUNTER — Ambulatory Visit
Admission: RE | Admit: 2023-02-15 | Discharge: 2023-02-15 | Disposition: A | Discharge status: Home / Self Care | Payer: BC Managed Care – PPO | Source: Ambulatory Visit | Attending: Family Medicine | Admitting: Family Medicine

## 2023-02-15 ENCOUNTER — Other Ambulatory Visit: Payer: Self-pay | Admitting: Family Medicine

## 2023-02-15 DIAGNOSIS — T148XXA Other injury of unspecified body region, initial encounter: Secondary | ICD-10-CM | POA: Diagnosis not present

## 2023-02-15 DIAGNOSIS — M549 Dorsalgia, unspecified: Secondary | ICD-10-CM

## 2023-02-15 DIAGNOSIS — Z6827 Body mass index (BMI) 27.0-27.9, adult: Secondary | ICD-10-CM | POA: Diagnosis not present

## 2023-02-15 DIAGNOSIS — M546 Pain in thoracic spine: Secondary | ICD-10-CM | POA: Diagnosis not present

## 2023-02-28 ENCOUNTER — Other Ambulatory Visit: Payer: Self-pay

## 2023-02-28 ENCOUNTER — Emergency Department (HOSPITAL_COMMUNITY): Payer: BC Managed Care – PPO

## 2023-02-28 ENCOUNTER — Encounter (HOSPITAL_COMMUNITY): Payer: Self-pay | Admitting: *Deleted

## 2023-02-28 ENCOUNTER — Emergency Department (HOSPITAL_COMMUNITY)
Admission: EM | Admit: 2023-02-28 | Discharge: 2023-02-28 | Disposition: A | Discharge status: Home / Self Care | Payer: BC Managed Care – PPO | Attending: Emergency Medicine | Admitting: Emergency Medicine

## 2023-02-28 DIAGNOSIS — R109 Unspecified abdominal pain: Secondary | ICD-10-CM | POA: Diagnosis not present

## 2023-02-28 DIAGNOSIS — K5730 Diverticulosis of large intestine without perforation or abscess without bleeding: Secondary | ICD-10-CM | POA: Diagnosis not present

## 2023-02-28 DIAGNOSIS — M546 Pain in thoracic spine: Secondary | ICD-10-CM | POA: Insufficient documentation

## 2023-02-28 DIAGNOSIS — M545 Low back pain, unspecified: Secondary | ICD-10-CM | POA: Diagnosis not present

## 2023-02-28 DIAGNOSIS — K449 Diaphragmatic hernia without obstruction or gangrene: Secondary | ICD-10-CM | POA: Diagnosis not present

## 2023-02-28 DIAGNOSIS — M549 Dorsalgia, unspecified: Secondary | ICD-10-CM

## 2023-02-28 LAB — CBC
HCT: 40.2 % (ref 36.0–46.0)
Hemoglobin: 13.2 g/dL (ref 12.0–15.0)
MCH: 29.3 pg (ref 26.0–34.0)
MCHC: 32.8 g/dL (ref 30.0–36.0)
MCV: 89.3 fL (ref 80.0–100.0)
Platelets: 269 10*3/uL (ref 150–400)
RBC: 4.5 MIL/uL (ref 3.87–5.11)
RDW: 12.5 % (ref 11.5–15.5)
WBC: 7.7 10*3/uL (ref 4.0–10.5)
nRBC: 0 % (ref 0.0–0.2)

## 2023-02-28 LAB — URINALYSIS, ROUTINE W REFLEX MICROSCOPIC
Bilirubin Urine: NEGATIVE
Glucose, UA: NEGATIVE mg/dL
Hgb urine dipstick: NEGATIVE
Ketones, ur: NEGATIVE mg/dL
Leukocytes,Ua: NEGATIVE
Nitrite: NEGATIVE
Protein, ur: NEGATIVE mg/dL
Specific Gravity, Urine: 1.015 (ref 1.005–1.030)
pH: 6 (ref 5.0–8.0)

## 2023-02-28 LAB — COMPREHENSIVE METABOLIC PANEL
ALT: 22 U/L (ref 0–44)
AST: 21 U/L (ref 15–41)
Albumin: 4.2 g/dL (ref 3.5–5.0)
Alkaline Phosphatase: 86 U/L (ref 38–126)
Anion gap: 11 (ref 5–15)
BUN: 18 mg/dL (ref 6–20)
CO2: 21 mmol/L — ABNORMAL LOW (ref 22–32)
Calcium: 9.5 mg/dL (ref 8.9–10.3)
Chloride: 105 mmol/L (ref 98–111)
Creatinine, Ser: 0.94 mg/dL (ref 0.44–1.00)
GFR, Estimated: >60 mL/min (ref >60)
Glucose, Bld: 112 mg/dL — ABNORMAL HIGH (ref 70–99)
Potassium: 3.7 mmol/L (ref 3.5–5.1)
Sodium: 137 mmol/L (ref 135–145)
Total Bilirubin: 0.4 mg/dL (ref 0.3–1.2)
Total Protein: 7.7 g/dL (ref 6.5–8.1)

## 2023-02-28 LAB — LIPASE, BLOOD: Lipase: 26 U/L (ref 11–51)

## 2023-02-28 MED ORDER — ONDANSETRON 4 MG PO TBDP
8.0000 mg | ORAL_TABLET | Freq: Once | ORAL | Status: AC
  Administered 2023-02-28: 8 mg via ORAL
  Filled 2023-02-28: qty 2

## 2023-02-28 MED ORDER — LIDOCAINE 5 % EX PTCH
1.0000 | MEDICATED_PATCH | CUTANEOUS | Status: DC
  Administered 2023-02-28: 1 via TRANSDERMAL
  Filled 2023-02-28: qty 1

## 2023-02-28 MED ORDER — OXYCODONE-ACETAMINOPHEN 5-325 MG PO TABS
1.0000 | ORAL_TABLET | Freq: Once | ORAL | Status: AC
  Administered 2023-02-28: 1 via ORAL
  Filled 2023-02-28: qty 1

## 2023-02-28 MED ORDER — NAPROXEN 375 MG PO TABS: 375.0000 mg | ORAL_TABLET | Freq: Two times a day (BID) | ORAL | 0 refills | Status: DC

## 2023-02-28 MED ORDER — KETOROLAC TROMETHAMINE 60 MG/2ML IM SOLN
30.0000 mg | Freq: Once | INTRAMUSCULAR | Status: AC
  Administered 2023-02-28: 30 mg via INTRAMUSCULAR
  Filled 2023-02-28: qty 2

## 2023-02-28 NOTE — ED Provider Notes (Signed)
[place] EMERGENCY DEPARTMENT AT [facility] Provider Note  CSN: [id number] Arrival date & time: 02/28/23 1609  Chief Complaint(s) Flank Pain  HPI Kristen Humphrey is a 57 y.o. female {Add pertinent medical, surgical, social history, OB history to HPI:1}    Flank Pain    Past Medical History Past Medical History:  Diagnosis Date   Anemia    PONV (postoperative nausea and vomiting)    Patient Active Problem List   Diagnosis Date Noted   Special screening for malignant neoplasms, colon 06/18/2016   Home Medication(s) Prior to Admission medications   Medication Sig Start Date End Date Taking? Authorizing Provider  naproxen (NAPROSYN) 375 MG tablet Take 1 tablet (375 mg total) by mouth 2 (two) times daily. 02/28/23  Yes Quianna Avery, Amadeo Garnet, MD  atorvastatin (LIPITOR) 20 MG tablet Take 20 mg by mouth daily.    [provider]  ibuprofen (ADVIL,MOTRIN) 200 MG tablet Take 400 mg by mouth every 6 (six) hours as needed (for pain/inflammation).     [provider]                                                                                                                                    Allergies Sulfa antibiotics  Review of Systems Review of Systems  Genitourinary:  Positive for flank pain.   As noted in HPI  Physical Exam Vital Signs  I have reviewed the triage vital signs BP 138/61   Pulse (!) 57   Temp 98 F (36.7 C) (Oral)   Resp 18   Ht  (1.626 m)   Wt 69.9 kg   SpO2 100%   BMI 26.45 kg/m  *** Physical Exam Vitals reviewed.  Constitutional:      General: She is not in acute distress.    Appearance: She is well-developed. She is not diaphoretic.  HENT:     Head: Normocephalic and atraumatic.     Right Ear: External ear normal.     Left Ear: External ear normal.     Nose: Nose normal.  Eyes:     General: No scleral icterus.    Conjunctiva/sclera: Conjunctivae normal.  Neck:     Trachea: Phonation normal.   Cardiovascular:     Rate and Rhythm: Normal rate and regular rhythm.  Pulmonary:     Effort: Pulmonary effort is normal. No respiratory distress.     Breath sounds: No stridor.  Abdominal:     General: There is no distension.  Musculoskeletal:        General: Normal range of motion.     Cervical back: Normal range of motion. No bony tenderness.     Thoracic back: Tenderness present. No bony tenderness.     Lumbar back: No bony tenderness.       Back:  Neurological:     Mental Status: She is alert and oriented to person, place, and time.  Psychiatric:        Behavior: Behavior normal.     ED Results and Treatments Labs (all labs ordered are listed, but only abnormal results are displayed) Labs Reviewed  COMPREHENSIVE METABOLIC PANEL - Abnormal; Notable for the following components:      Result Value   CO2 21 (*)    Glucose, Bld 112 (*)    All other components within normal limits  URINALYSIS, ROUTINE W REFLEX MICROSCOPIC - Abnormal; Notable for the following components:   APPearance HAZY (*)    All other components within normal limits  LIPASE, BLOOD  CBC                                                                                                                         EKG  EKG Interpretation  Date/Time:    Ventricular Rate:    PR Interval:    QRS Duration:   QT Interval:    QTC Calculation:   R Axis:     Text Interpretation:         Radiology CT Renal Stone Study  Result Date: 02/28/2023 CLINICAL DATA:  Symptomatic urolithiasis EXAM: CT ABDOMEN AND PELVIS WITHOUT CONTRAST TECHNIQUE: Multidetector CT imaging of the abdomen and pelvis was performed following the standard protocol without IV contrast. Unenhanced CT was performed per clinician order. Lack of IV contrast limits sensitivity and specificity, especially for evaluation of abdominal/pelvic solid viscera. RADIATION DOSE REDUCTION: This exam was performed according to the departmental dose-optimization  program which includes automated exposure control, adjustment of the mA and/or kV according to patient size and/or use of iterative reconstruction technique. COMPARISON:  04/04/2015 FINDINGS: Lower chest: No acute pleural or parenchymal lung disease. Chronic scarring and mild bronchiectasis of the right middle lobe. Small hiatal hernia. Hepatobiliary: Unremarkable unenhanced appearance of the liver and gallbladder. No biliary duct dilation. Pancreas: Unremarkable unenhanced appearance. Spleen: Unremarkable unenhanced appearance. Adrenals/Urinary Tract: No urinary tract calculi or obstructive uropathy. The adrenals and bladder are unremarkable. Stomach/Bowel: No bowel obstruction or ileus. Normal appendix right lower quadrant. Scattered diverticulosis of the descending and sigmoid colon without diverticulitis. No bowel wall thickening or inflammatory change. Vascular/Lymphatic: Aortic atherosclerosis. No enlarged abdominal or pelvic lymph nodes. Reproductive: Uterus and bilateral adnexa are unremarkable. Other: No free fluid or free intraperitoneal gas. No abdominal wall hernia. Musculoskeletal: No acute or destructive bony lesions. Reconstructed images demonstrate no additional findings. IMPRESSION: 1. No urinary tract calculi or obstructive uropathy within either kidney. 2. Small hiatal hernia. 3. Distal colonic diverticulosis without diverticulitis. 4.  Aortic Atherosclerosis (ICD10-I70.0). Electronically Signed   By: Sharlet Salina M.D.   On: 02/28/2023 17:14    Medications Ordered in ED Medications  lidocaine (LIDODERM) 5 % 1 patch (1 patch Transdermal Patch Applied 02/28/23 2209)  ondansetron (ZOFRAN-ODT) disintegrating tablet 8 mg (8 mg Oral Given 02/28/23 1639)  oxyCODONE-acetaminophen (PERCOCET/ROXICET) 5-325 MG per tablet 1 tablet (1 tablet Oral Given 02/28/23 1639)  ketorolac (TORADOL) injection 30 mg (30 mg Intramuscular  Given 02/28/23 2210)                                                                                                                                      Procedures Procedures  (including critical care time)  Medical Decision Making / ED Course  Click here for ABCD2, HEART and other calculators  Medical Decision Making Risk Prescription drug management.          Final Clinical Impression(s) / ED Diagnoses Final diagnoses:  Mid back pain on left side    {Document critical care time when appropriate:1}  {Document review of labs and clinical decision tools ie heart score, Chads2Vasc2 etc:1}  {Document your independent review of radiology images, and any outside records:1} {Document your discussion with family members, caretakers, and with consultants:1} {Document social determinants of health affecting pt's care:1} {Document your decision making why or why not admission, treatments were needed:1} This chart was dictated using voice recognition software.  Despite best efforts to proofread,  errors can occur which can change the documentation meaning.

## 2023-02-28 NOTE — Discharge Instructions (Addendum)
In addition to the prescribed naproxen, you may use over-the-counter Acetaminophen (Tylenol) [1000mg  every 8 hours as needed], topical muscle creams such as SalonPas, [employer], Bengay, etc. Please stretch, apply ice or heat (whichever helps), and have massage therapy for additional assistance.

## 2023-02-28 NOTE — ED Triage Notes (Signed)
Lt flank pain for 2 weeks. No bloody urine  no urinary frequency  she was seen for the same 2 weeks ago and had lab and xrays  no dx was found  today the pain is unbearable  Kristen Humphrey has had robaxin

## 2023-03-02 DIAGNOSIS — T148XXA Other injury of unspecified body region, initial encounter: Secondary | ICD-10-CM | POA: Diagnosis not present

## 2023-03-02 DIAGNOSIS — M549 Dorsalgia, unspecified: Secondary | ICD-10-CM | POA: Diagnosis not present

## 2023-03-02 DIAGNOSIS — Z0000 Encounter for general adult medical examination without abnormal findings: Secondary | ICD-10-CM | POA: Diagnosis not present

## 2023-04-13 DIAGNOSIS — T148XXA Other injury of unspecified body region, initial encounter: Secondary | ICD-10-CM | POA: Diagnosis not present

## 2023-08-27 IMAGING — MG MM DIGITAL SCREENING BILAT W/ TOMO AND CAD
6 of 12 series · 6 of 36 positions shown · non-contrast
Comparison: Previous exam(s).

CLINICAL DATA: Screening.

EXAM:
DIGITAL SCREENING BILATERAL MAMMOGRAM WITH TOMOSYNTHESIS AND CAD
TECHNIQUE: Bilateral screening digital craniocaudal and mediolateral oblique
mammograms were obtained. Bilateral screening digital breast
tomosynthesis was performed. The images were evaluated with
computer-aided detection.

[L CC synth-2D]
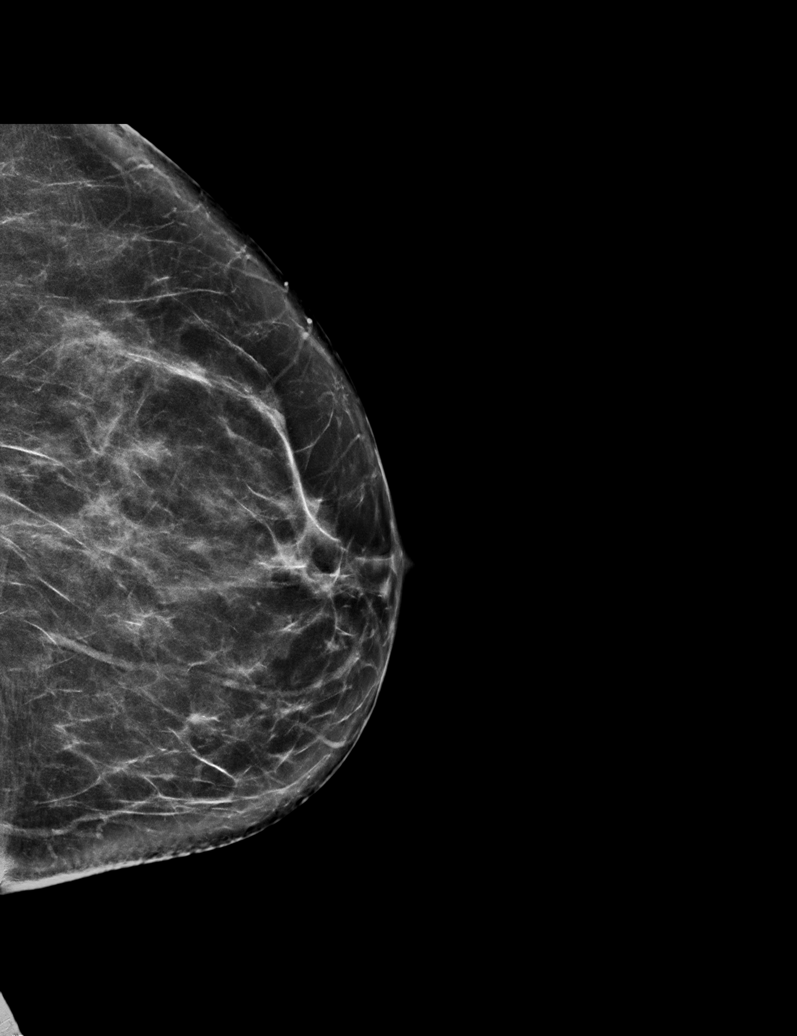

[R MLO synth-2D (1 of 2)]
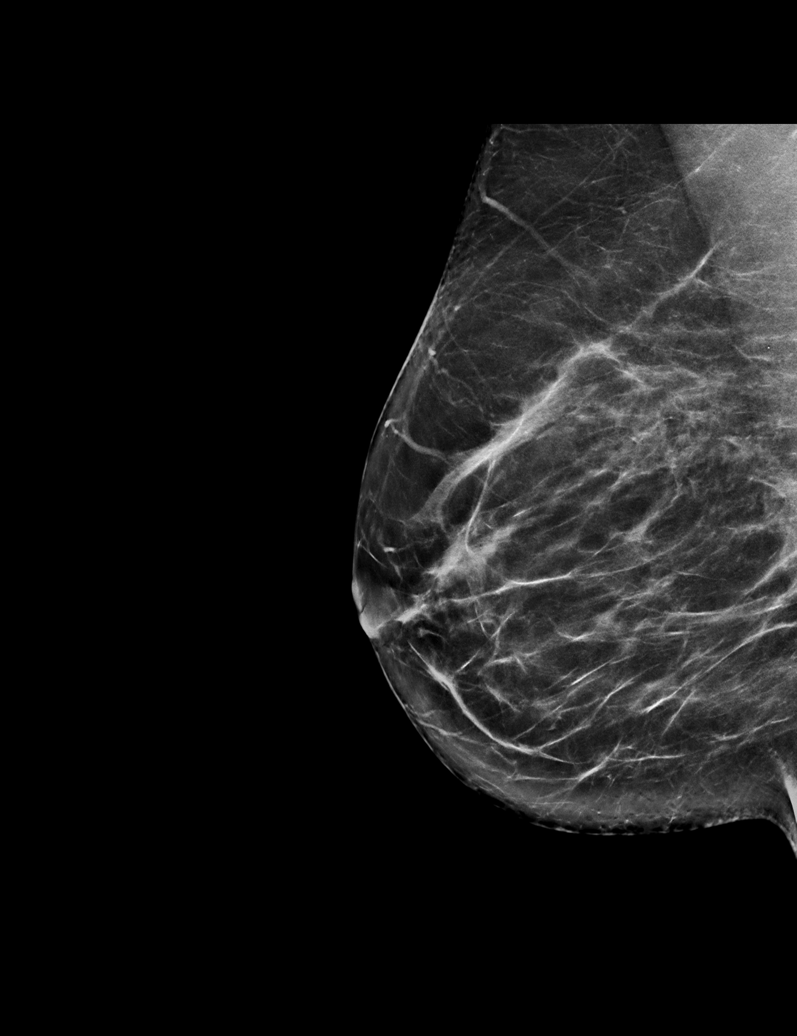

[R MLO synth-2D (2 of 2)]
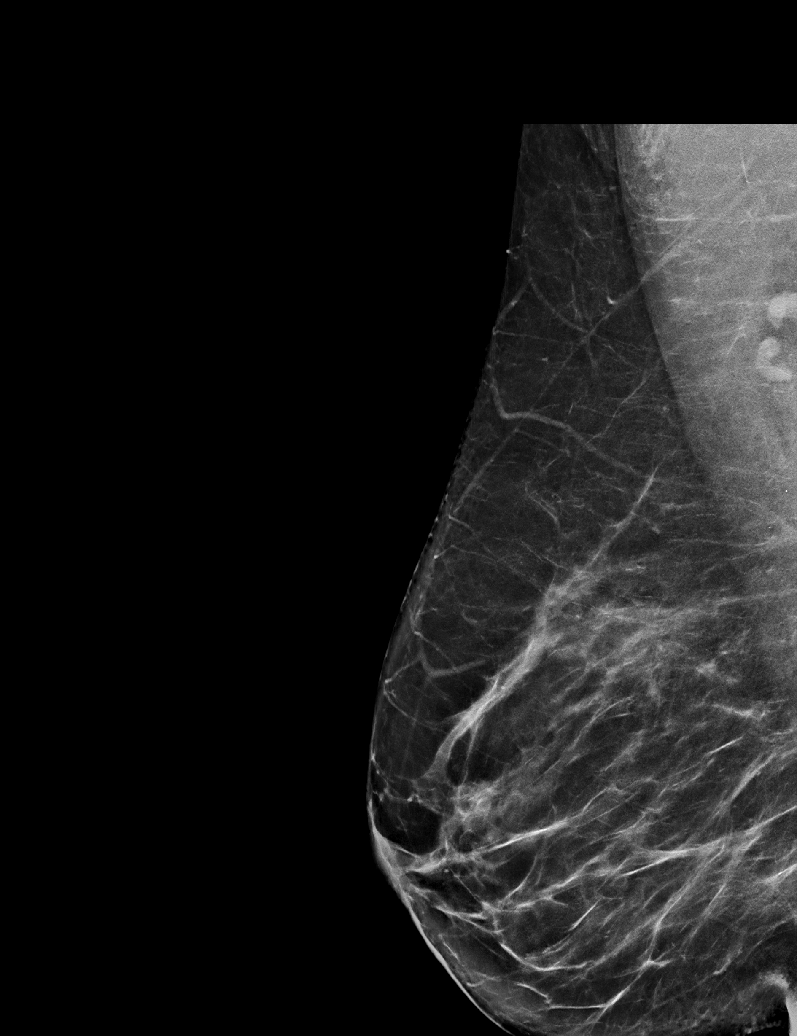

[L MLO synth-2D (1 of 2)]
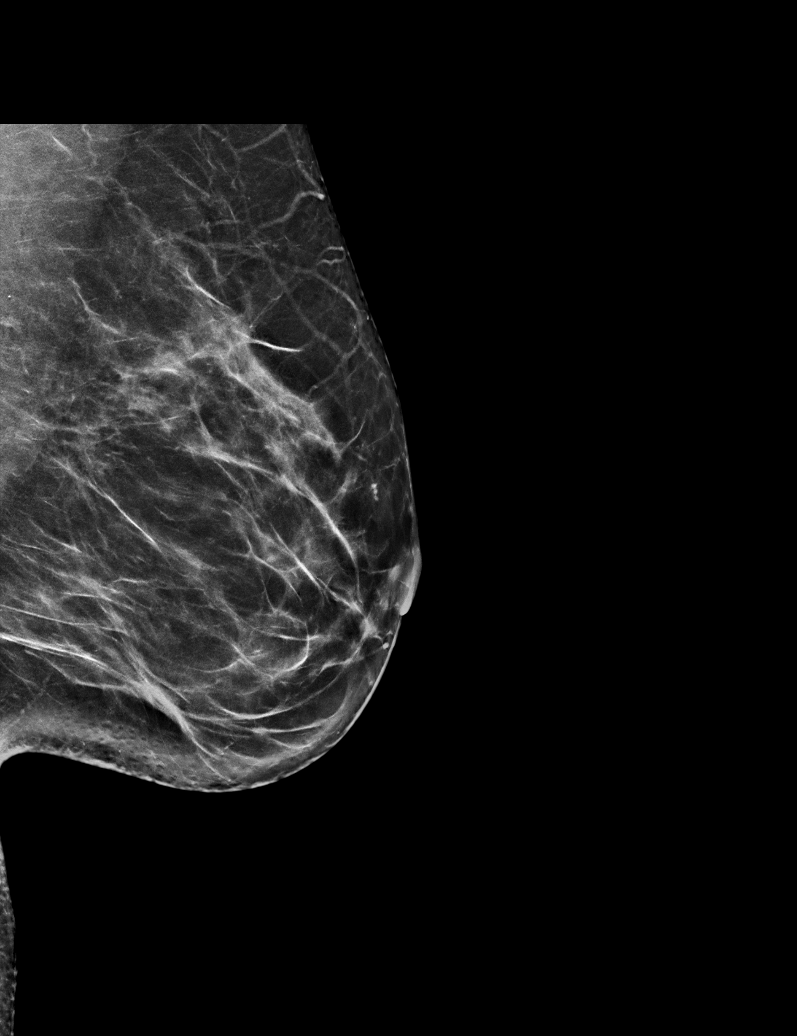

[L MLO synth-2D (2 of 2)]
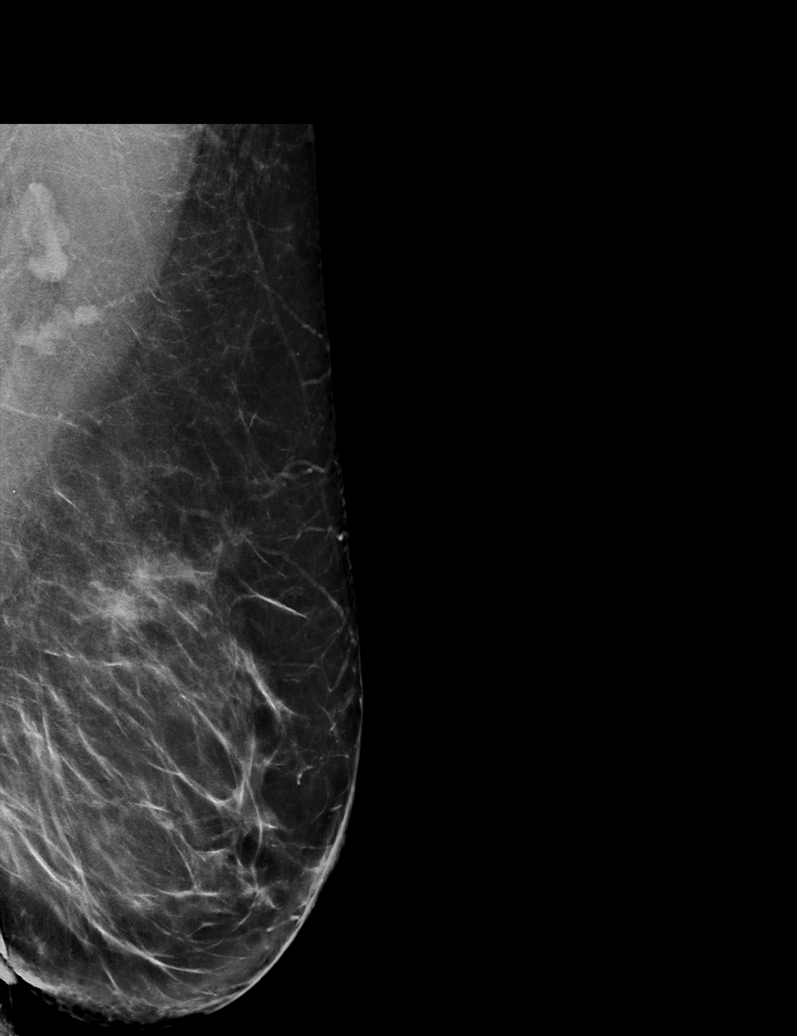

[R CC synth-2D]
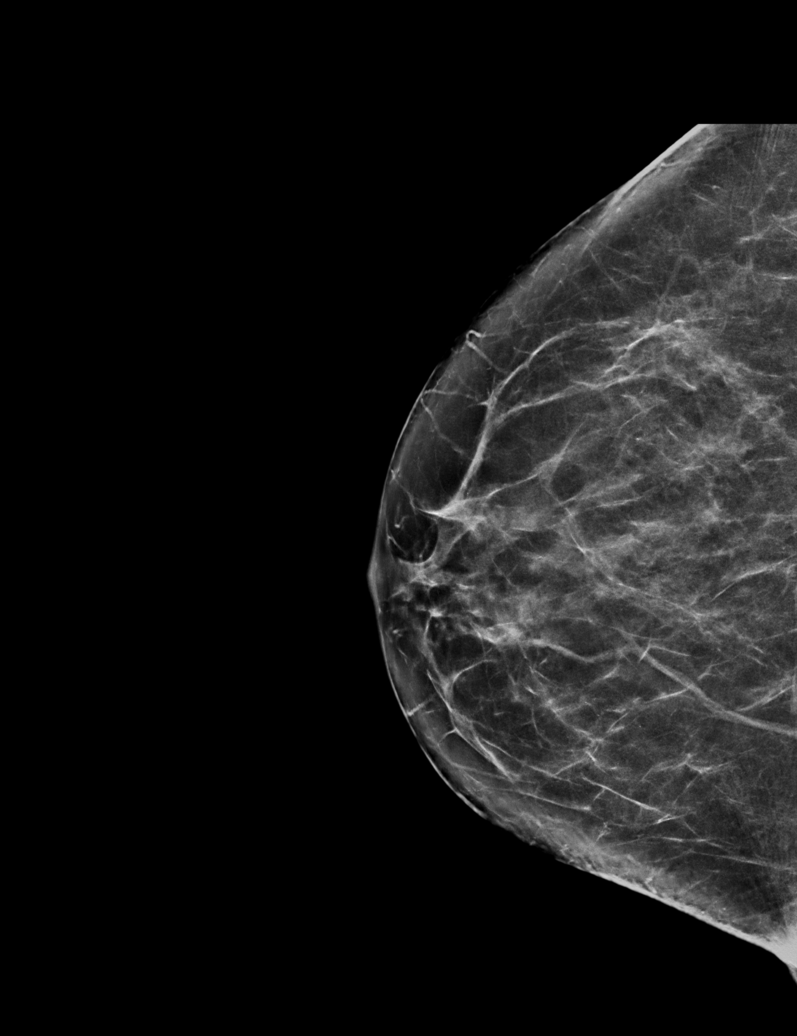

[6 of 36 positions shown; findings below may reference images not displayed]

ACR Breast Density Category b: There are scattered areas of
fibroglandular density.
FINDINGS: There are no findings suspicious for malignancy.
IMPRESSION: No mammographic evidence of malignancy. A result letter of this
screening mammogram will be mailed directly to the patient.

RECOMMENDATION:
Screening mammogram in one year. (Code:51-O-LD2)

BI-RADS CATEGORY  1: Negative.

## 2023-08-31 DIAGNOSIS — I10 Essential (primary) hypertension: Secondary | ICD-10-CM | POA: Diagnosis not present

## 2023-08-31 DIAGNOSIS — Z1211 Encounter for screening for malignant neoplasm of colon: Secondary | ICD-10-CM | POA: Diagnosis not present

## 2023-08-31 DIAGNOSIS — R5383 Other fatigue: Secondary | ICD-10-CM | POA: Diagnosis not present

## 2023-08-31 DIAGNOSIS — R7303 Prediabetes: Secondary | ICD-10-CM | POA: Diagnosis not present

## 2023-08-31 DIAGNOSIS — E7800 Pure hypercholesterolemia, unspecified: Secondary | ICD-10-CM | POA: Diagnosis not present

## 2023-09-01 ENCOUNTER — Encounter (INDEPENDENT_AMBULATORY_CARE_PROVIDER_SITE_OTHER): Payer: Self-pay | Admitting: *Deleted

## 2023-10-04 ENCOUNTER — Encounter (INDEPENDENT_AMBULATORY_CARE_PROVIDER_SITE_OTHER): Payer: Self-pay | Admitting: *Deleted

## 2023-11-08 ENCOUNTER — Telehealth (INDEPENDENT_AMBULATORY_CARE_PROVIDER_SITE_OTHER): Payer: Self-pay | Admitting: Gastroenterology

## 2023-11-08 NOTE — Telephone Encounter (Signed)
Who is your primary care physician: Dr.Hannah [facility]  Reasons for the colonoscopy: hx polyps  Have you had a colonoscopy before?  Yes 10/14/16  Do you have family history of colon cancer? no  Previous colonoscopy with polyps removed? Yes 10/2016  Do you have a history colorectal cancer?   no  Are you diabetic? If yes, Type 1 or Type 2?    no  Do you have a prosthetic or mechanical heart valve? no  Do you have a pacemaker/defibrillator?   no  Have you had endocarditis/atrial fibrillation? no  Have you had joint replacement within the last 12 months?  no  Do you tend to be constipated or have to use laxatives? no  Do you have any history of drugs or alchohol?  no  Do you use supplemental oxygen?  no  Have you had a stroke or heart attack within the last 6 months? no  Do you take weight loss medication?  no  For female patients: have you had a hysterectomy?  no                                     are you post menopausal?       yes                                            do you still have your menstrual cycle? no      Do you take any blood-thinning medications such as: (aspirin, warfarin, Plavix, Aggrenox)  no  If yes we need the name, milligram, dosage and who is prescribing doctor  Current Outpatient Medications on File Prior to Visit  Medication Sig Dispense Refill   atorvastatin (LIPITOR) 20 MG tablet Take 20 mg by mouth daily.     ibuprofen (ADVIL,MOTRIN) 200 MG tablet Take 400 mg by mouth every 6 (six) hours as needed (for pain/inflammation).      losartan (COZAAR) 100 MG tablet Take 100 mg by mouth daily.     naproxen (NAPROSYN) 375 MG tablet Take 1 tablet (375 mg total) by mouth 2 (two) times daily. 20 tablet 0   No current facility-administered medications on file prior to visit.    Allergies  Allergen Reactions   Sulfa Antibiotics Other (See Comments)    Stomatitis      Pharmacy: [facility]   Primary Insurance Name: BCBS  Best number  where you can be reached: [phone]

## 2023-11-08 NOTE — Telephone Encounter (Signed)
 Ok to schedule.  Room 1/2  Thanks,  Vista Lawman, MD Gastroenterology and Hepatology [facility]

## 2023-11-12 MED ORDER — PEG 3350-KCL-NA BICARB-NACL 420 G PO SOLR: 4000.0000 mL | Freq: Once | ORAL | 0 refills | Status: AC

## 2023-11-12 NOTE — Addendum Note (Signed)
 Addended by: Marlowe Shores on: 11/12/2023 11:03 AM   Modules accepted: Orders

## 2023-11-12 NOTE — Telephone Encounter (Signed)
 Pt contacted and scheduled. No PA needed via [employer]. Instructions mailed to patient. Prep sent to pharmacy

## 2023-11-15 ENCOUNTER — Encounter (INDEPENDENT_AMBULATORY_CARE_PROVIDER_SITE_OTHER): Payer: Self-pay | Admitting: *Deleted

## 2023-11-15 NOTE — Telephone Encounter (Signed)
 Referral completed, [facility] apt letter sent to PCP

## 2023-12-07 ENCOUNTER — Ambulatory Visit (HOSPITAL_COMMUNITY): Payer: Self-pay | Admitting: Anesthesiology

## 2023-12-07 ENCOUNTER — Other Ambulatory Visit: Payer: Self-pay

## 2023-12-07 ENCOUNTER — Ambulatory Visit (HOSPITAL_COMMUNITY)
Admission: RE | Admit: 2023-12-07 | Discharge: 2023-12-07 | Disposition: A | Discharge status: Home / Self Care | Payer: BC Managed Care – PPO | Attending: Gastroenterology | Admitting: Gastroenterology

## 2023-12-07 ENCOUNTER — Encounter (HOSPITAL_COMMUNITY): Payer: Self-pay | Admitting: Gastroenterology

## 2023-12-07 ENCOUNTER — Encounter (HOSPITAL_COMMUNITY)
Admission: RE | Disposition: A | Discharge status: Home / Self Care | Payer: Self-pay | Source: Home / Self Care | Attending: Gastroenterology

## 2023-12-07 DIAGNOSIS — K648 Other hemorrhoids: Secondary | ICD-10-CM | POA: Insufficient documentation

## 2023-12-07 DIAGNOSIS — Z1211 Encounter for screening for malignant neoplasm of colon: Secondary | ICD-10-CM | POA: Insufficient documentation

## 2023-12-07 DIAGNOSIS — D125 Benign neoplasm of sigmoid colon: Secondary | ICD-10-CM | POA: Diagnosis not present

## 2023-12-07 DIAGNOSIS — D122 Benign neoplasm of ascending colon: Secondary | ICD-10-CM | POA: Insufficient documentation

## 2023-12-07 DIAGNOSIS — K635 Polyp of colon: Secondary | ICD-10-CM | POA: Diagnosis not present

## 2023-12-07 DIAGNOSIS — K644 Residual hemorrhoidal skin tags: Secondary | ICD-10-CM | POA: Diagnosis not present

## 2023-12-07 DIAGNOSIS — Z860100 Personal history of colon polyps, unspecified: Secondary | ICD-10-CM | POA: Diagnosis not present

## 2023-12-07 DIAGNOSIS — I10 Essential (primary) hypertension: Secondary | ICD-10-CM | POA: Insufficient documentation

## 2023-12-07 DIAGNOSIS — K5730 Diverticulosis of large intestine without perforation or abscess without bleeding: Secondary | ICD-10-CM

## 2023-12-07 DIAGNOSIS — D123 Benign neoplasm of transverse colon: Secondary | ICD-10-CM | POA: Insufficient documentation

## 2023-12-07 DIAGNOSIS — K6389 Other specified diseases of intestine: Secondary | ICD-10-CM | POA: Diagnosis not present

## 2023-12-07 HISTORY — PX: POLYPECTOMY: SHX5525

## 2023-12-07 HISTORY — PX: COLONOSCOPY WITH PROPOFOL: SHX5780

## 2023-12-07 LAB — HM COLONOSCOPY

## 2023-12-07 SURGERY — COLONOSCOPY WITH PROPOFOL
Anesthesia: General

## 2023-12-07 MED ORDER — LIDOCAINE HCL (PF) 2 % IJ SOLN
INTRAMUSCULAR | Status: DC | PRN
  Administered 2023-12-07: 50 mg via INTRADERMAL

## 2023-12-07 MED ORDER — LACTATED RINGERS IV SOLN: INTRAVENOUS | Status: DC

## 2023-12-07 MED ORDER — PROPOFOL 500 MG/50ML IV EMUL
INTRAVENOUS | Status: DC | PRN
  Administered 2023-12-07: 150 ug/kg/min via INTRAVENOUS

## 2023-12-07 MED ORDER — PROPOFOL 10 MG/ML IV BOLUS
INTRAVENOUS | Status: DC | PRN
  Administered 2023-12-07: 100 mg via INTRAVENOUS
  Administered 2023-12-07: 70 mg via INTRAVENOUS

## 2023-12-07 NOTE — Anesthesia Preprocedure Evaluation (Signed)
Anesthesia Evaluation  Patient identified by MRN, date of birth, ID band Patient awake    Reviewed: Allergy & Precautions, H&P , NPO status , Patient's Chart, lab work & pertinent test results, reviewed documented beta blocker date and time   History of Anesthesia Complications (+) PONV and history of anesthetic complications  Airway Mallampati: II  TM Distance: >3 FB Neck ROM: full    Dental no notable dental hx.    Pulmonary neg pulmonary ROS   Pulmonary exam normal breath sounds clear to auscultation       Cardiovascular Exercise Tolerance: Good hypertension, negative cardio ROS  Rhythm:regular Rate:Normal     Neuro/Psych negative neurological ROS  negative psych ROS   GI/Hepatic negative GI ROS, Neg liver ROS,,,  Endo/Other  negative endocrine ROS    Renal/GU negative Renal ROS  negative genitourinary   Musculoskeletal   Abdominal   Peds  Hematology negative hematology ROS (+) Blood dyscrasia, anemia   Anesthesia Other Findings   Reproductive/Obstetrics negative OB ROS                             Anesthesia Physical Anesthesia Plan  ASA: 2  Anesthesia Plan: General   Post-op Pain Management:    Induction:   PONV Risk Score and Plan: Propofol infusion  Airway Management Planned:   Additional Equipment:   Intra-op Plan:   Post-operative Plan:   Informed Consent: I have reviewed the patients History and Physical, chart, labs and discussed the procedure including the risks, benefits and alternatives for the proposed anesthesia with the patient or authorized representative who has indicated his/her understanding and acceptance.     Dental Advisory Given  Plan Discussed with: CRNA  Anesthesia Plan Comments:        Anesthesia Quick Evaluation

## 2023-12-07 NOTE — Anesthesia Procedure Notes (Signed)
Date/Time: 12/07/2023 7:30 AM  Performed by: Julian Reil, CRNAPre-anesthesia Checklist: Patient identified, Emergency Drugs available, Suction available and Patient being monitored Patient Re-evaluated:Patient Re-evaluated prior to induction Oxygen Delivery Method: Nasal cannula Induction Type: IV induction Placement Confirmation: positive ETCO2

## 2023-12-07 NOTE — H&P (Signed)
Primary Care Physician:  Lind Covert, MD Primary Gastroenterologist:  Dr. Tasia Catchings  Pre-Procedure History & Physical: HPI:  Kristen Humphrey is a 58 y.o. female is here for a colonoscopy for colon cancer screening purposes.  Patient denies any family history of colorectal cancer.  No melena or hematochezia.  No abdominal pain or unintentional weight loss.  No change in bowel habits.  Overall feels well from a GI standpoint.  2017 - One 4 mm polyp in the proximal sigmoid colon, removed with a cold snare. Resected and retrieved. - External hemorrhoids.  Past Medical History:  Diagnosis Date   Anemia    PONV (postoperative nausea and vomiting)     Past Surgical History:  Procedure Laterality Date   ADENOIDECTOMY     COLONOSCOPY N/A 10/14/2016   Procedure: COLONOSCOPY;  Surgeon: Malissa Hippo, MD;  Location: [facility];  Service: Endoscopy;  Laterality: N/A;  930   POLYPECTOMY  10/14/2016   Procedure: POLYPECTOMY;  Surgeon: Malissa Hippo, MD;  Location: [facility]ITE;  Service: Endoscopy;;  sigmoid   TONSILLECTOMY     TUBAL LIGATION      Prior to Admission medications   Medication Sig Start Date End Date Taking? Authorizing Provider  atorvastatin (LIPITOR) 20 MG tablet Take 20 mg by mouth daily.   Yes [provider]  ibuprofen (ADVIL,MOTRIN) 200 MG tablet Take 400 mg by mouth every 6 (six) hours as needed (for pain/inflammation).    Yes [provider]  losartan (COZAAR) 100 MG tablet Take 100 mg by mouth daily.   Yes [provider]  naproxen (NAPROSYN) 375 MG tablet Take 1 tablet (375 mg total) by mouth 2 (two) times daily. 02/28/23  Yes Cardama, Amadeo Garnet, MD    Allergies as of 11/12/2023 - Review Complete 11/08/2023  Allergen Reaction Noted   Sulfa antibiotics Other (See Comments) 04/04/2015    History reviewed. No pertinent family history.  Social History   Socioeconomic History   Marital status: Married    Spouse name: Not on file    Number of children: Not on file   Years of education: Not on file   Highest education level: Not on file  Occupational History   Not on file  Tobacco Use   Smoking status: Never   Smokeless tobacco: Never  Vaping Use   Vaping status: Never Used  Substance and Sexual Activity   Alcohol use: Yes    Comment: occ   Drug use: No   Sexual activity: Not on file  Other Topics Concern   Not on file  Social History Narrative   Not on file   Social Drivers of Health   Financial Resource Strain: Not on file  Food Insecurity: Not on file  Transportation Needs: Not on file  Physical Activity: Not on file  Stress: Not on file  Social Connections: Not on file  Intimate Partner Violence: Not on file    Review of Systems: See HPI, otherwise negative ROS  Physical Exam: Vital signs in last 24 hours: Temp:  [97.6 F (36.4 C)] 97.6 F (36.4 C) (01/28 0658) Pulse Rate:  [77] 77 (01/28 0658) Resp:  [18] 18 (01/28 0658) BP: (150)/(71) 150/71 (01/28 0658) SpO2:  [98 %] 98 % (01/28 0658) Weight:  [77.1 kg] 77.1 kg (01/28 0658)   General:   Alert,  Well-developed, well-nourished, pleasant and cooperative in NAD Head:  Normocephalic and atraumatic. Eyes:  Sclera clear, no icterus.   Conjunctiva pink. Ears:  Normal auditory  acuity. Nose:  No deformity, discharge,  or lesions. Msk:  Symmetrical without gross deformities. Normal posture. Extremities:  Without clubbing or edema. Neurologic:  Alert and  oriented x4;  grossly normal neurologically. Skin:  Intact without significant lesions or rashes. Psych:  Alert and cooperative. Normal mood and affect.  Impression/Plan: Kristen Humphrey is here for a colonoscopy to be performed for colon cancer screening purposes.  The risks of the procedure including infection, bleed, or perforation as well as benefits, limitations, alternatives and imponderables have been reviewed with the patient. Questions have been answered. All parties agreeable.

## 2023-12-07 NOTE — Discharge Instructions (Signed)

## 2023-12-07 NOTE — Transfer of Care (Signed)
Immediate Anesthesia Transfer of Care Note  Patient: Kristen Humphrey  Procedure(s) Performed: COLONOSCOPY WITH PROPOFOL POLYPECTOMY  Patient Location: Endoscopy Unit  Anesthesia Type:General  Level of Consciousness: drowsy  Airway & Oxygen Therapy: Patient Spontanous Breathing  Post-op Assessment: Report given to RN and Post -op Vital signs reviewed and stable  Post vital signs: Reviewed and stable  Last Vitals:  Vitals Value Taken Time  BP    Temp    Pulse    Resp    SpO2      Last Pain:  Vitals:   12/07/23 0734  TempSrc:   PainSc: 0-No pain      Patients Stated Pain Goal: 6 (12/07/23 7829)  Complications: No notable events documented.

## 2023-12-07 NOTE — Op Note (Signed)
[facility] Patient Name: Kristen Humphrey Procedure Date: 12/07/2023 7:03 AM MRN: [id number] Date of Birth: 16-Apr-1966 Attending MD: Sanjuan Dame , MD, [id number] CSN: [id number] Age: 58 Admit Type: Outpatient Procedure:                Colonoscopy Indications:              Screening for colorectal malignant neoplasm Providers:                Sanjuan Dame, MD, Sheran Fava, Dyann Ruddle Referring MD:              Medicines:                Monitored Anesthesia Care Complications:            No immediate complications. Estimated Blood Loss:     Estimated blood loss was minimal. Procedure:                Pre-Anesthesia Assessment:                           - Prior to the procedure, a History and Physical                            was performed, and patient medications and                            allergies were reviewed. The patient's tolerance of                            previous anesthesia was also reviewed. The risks                            and benefits of the procedure and the sedation                            options and risks were discussed with the patient.                            All questions were answered, and informed consent                            was obtained. Prior Anticoagulants: The patient has                            taken no anticoagulant or antiplatelet agents. ASA                            Grade Assessment: II - A patient with mild systemic                            disease. After reviewing the risks and benefits,                            the patient was deemed in satisfactory condition to  undergo the procedure.                           After obtaining informed consent, the colonoscope                            was passed under direct vision. Throughout the                            procedure, the patient's blood pressure, pulse, and                            oxygen saturations were monitored  continuously. The                            [id number]) scope was introduced through the                            anus and advanced to the the cecum, identified by                            appendiceal orifice and ileocecal valve. The                            colonoscopy was performed without difficulty. The                            patient tolerated the procedure well. The quality                            of the bowel preparation was evaluated using the                            BBPS [facility] Bowel Preparation Scale) with scores                            of: Right Colon = 3, Transverse Colon = 3 and Left                            Colon = 3 (entire mucosa seen well with no residual                            staining, small fragments of stool or opaque                            liquid). The total BBPS score equals 9. The                            ileocecal valve, appendiceal orifice, and rectum                            were photographed. Scope In: 7:35:21 AM Scope Out: 7:51:13 AM Scope Withdrawal Time: 0 hours 12 minutes 39 seconds  Total  Procedure Duration: 0 hours 15 minutes 52 seconds  Findings:      The perianal and digital rectal examinations were normal.      Three sessile polyps were found in the sigmoid colon, transverse colon       and ascending colon. The polyps were 2 to 4 mm in size. These polyps       were removed with a cold snare. Resection and retrieval were complete.      Non-bleeding external and internal hemorrhoids were found during       retroflexion. The hemorrhoids were small.      Scattered diverticula were found in the left colon. Impression:               - Three 2 to 4 mm polyps in the sigmoid colon, in                            the transverse colon and in the ascending colon,                            removed with a cold snare. Resected and retrieved.                           - Non-bleeding external and internal hemorrhoids. Moderate  Sedation:      Per Anesthesia Care Recommendation:           - Patient has a contact number available for                            emergencies. The signs and symptoms of potential                            delayed complications were discussed with the                            patient. Return to normal activities tomorrow.                            Written discharge instructions were provided to the                            patient.                           - Resume previous diet.                           - Continue present medications.                           - Await pathology results.                           - Repeat colonoscopy in 7-10 years for surveillance. Procedure Code(s):        --- Professional ---                           [id number], Colonoscopy, flexible; with  removal of                            tumor(s), polyp(s), or other lesion(s) by snare                            technique Diagnosis Code(s):        --- Professional ---                           Z12.11, Encounter for screening for malignant                            neoplasm of colon                           D12.5, Benign neoplasm of sigmoid colon                           D12.3, Benign neoplasm of transverse colon (hepatic                            flexure or splenic flexure)                           D12.2, Benign neoplasm of ascending colon                           K64.8, Other hemorrhoids CPT copyright 2022 American Medical Association. All rights reserved. The codes documented in this report are preliminary and upon coder review may  be revised to meet current compliance requirements. Sanjuan Dame, MD Sanjuan Dame, MD 12/07/2023 8:00:10 AM This report has been signed electronically. Number of Addenda: 0

## 2023-12-08 ENCOUNTER — Encounter (INDEPENDENT_AMBULATORY_CARE_PROVIDER_SITE_OTHER): Payer: Self-pay | Admitting: *Deleted

## 2023-12-08 ENCOUNTER — Encounter (HOSPITAL_COMMUNITY): Payer: Self-pay | Admitting: Gastroenterology

## 2023-12-08 NOTE — Anesthesia Postprocedure Evaluation (Signed)
Anesthesia Post Note  Patient: Kristen Humphrey  Procedure(s) Performed: COLONOSCOPY WITH PROPOFOL POLYPECTOMY  Patient location during evaluation: Phase II Anesthesia Type: General Level of consciousness: awake Pain management: pain level controlled Vital Signs Assessment: post-procedure vital signs reviewed and stable Respiratory status: spontaneous breathing and respiratory function stable Cardiovascular status: blood pressure returned to baseline and stable Postop Assessment: no headache and no apparent nausea or vomiting Anesthetic complications: no Comments: Late entry   No notable events documented.   Last Vitals:  Vitals:   12/07/23 0755 12/07/23 0758  BP: (!) 96/50 99/70  Pulse: 69 93  Resp: 17 (!) 22  Temp: 36.4 C   SpO2: 96% 95%    Last Pain:  Vitals:   12/07/23 0755  TempSrc: Oral  PainSc: 0-No pain                 Windell Norfolk

## 2023-12-09 LAB — SURGICAL PATHOLOGY

## 2023-12-13 NOTE — Progress Notes (Signed)
I reviewed the pathology results. [name], can you send her a letter with the findings as described below please?  Repeat colonoscopy in 7 years  Thanks,  Vista Lawman, MD Gastroenterology and Hepatology [facility]  ---------------------------------------------------------------------------------------------  [facility] [address], [place], [place] [address] Phone:  [phone]   12/13/23 Kristen Humphrey, [place]   Dear [name],  I am writing to let you know the results of your recent colonoscopy.  You had a total of 3 polyps removed and they turn out to be normal colon mucosa   Given these findings, it is recommended that your next colonoscopy be performed in 7 years.  Also I value your feedback , so if you get a survey , please take the time to fill it out and thank you for choosing [facility]  Please call us at [phone] if you have persistent problems or have questions about your condition that have not been fully answered at this time.  Sincerely,  Vista Lawman, MD Gastroenterology and Hepatology

## 2023-12-14 ENCOUNTER — Encounter (INDEPENDENT_AMBULATORY_CARE_PROVIDER_SITE_OTHER): Payer: Self-pay | Admitting: *Deleted

## 2023-12-24 ENCOUNTER — Other Ambulatory Visit (HOSPITAL_COMMUNITY): Payer: Self-pay | Admitting: Family Medicine

## 2023-12-24 DIAGNOSIS — Z1231 Encounter for screening mammogram for malignant neoplasm of breast: Secondary | ICD-10-CM

## 2024-01-05 ENCOUNTER — Ambulatory Visit (HOSPITAL_COMMUNITY)
Admission: RE | Admit: 2024-01-05 | Discharge: 2024-01-05 | Disposition: A | Discharge status: Home / Self Care | Payer: BC Managed Care – PPO | Source: Ambulatory Visit | Attending: Family Medicine | Admitting: Family Medicine

## 2024-01-05 DIAGNOSIS — Z1231 Encounter for screening mammogram for malignant neoplasm of breast: Secondary | ICD-10-CM | POA: Insufficient documentation

## 2024-03-02 ENCOUNTER — Other Ambulatory Visit (HOSPITAL_COMMUNITY): Payer: Self-pay | Admitting: Family Medicine

## 2024-03-02 DIAGNOSIS — Z0000 Encounter for general adult medical examination without abnormal findings: Secondary | ICD-10-CM | POA: Diagnosis not present

## 2024-03-02 DIAGNOSIS — E7800 Pure hypercholesterolemia, unspecified: Secondary | ICD-10-CM | POA: Diagnosis not present

## 2024-03-02 DIAGNOSIS — E28319 Asymptomatic premature menopause: Secondary | ICD-10-CM | POA: Diagnosis not present

## 2024-03-02 DIAGNOSIS — E538 Deficiency of other specified B group vitamins: Secondary | ICD-10-CM | POA: Diagnosis not present

## 2024-03-02 DIAGNOSIS — Z23 Encounter for immunization: Secondary | ICD-10-CM | POA: Diagnosis not present

## 2024-03-02 DIAGNOSIS — Z124 Encounter for screening for malignant neoplasm of cervix: Secondary | ICD-10-CM | POA: Diagnosis not present

## 2024-03-02 DIAGNOSIS — R7303 Prediabetes: Secondary | ICD-10-CM | POA: Diagnosis not present

## 2024-03-02 DIAGNOSIS — I10 Essential (primary) hypertension: Secondary | ICD-10-CM | POA: Diagnosis not present

## 2024-03-14 ENCOUNTER — Ambulatory Visit (HOSPITAL_COMMUNITY)
Admission: RE | Admit: 2024-03-14 | Discharge: 2024-03-14 | Disposition: A | Discharge status: Home / Self Care | Source: Ambulatory Visit | Attending: Family Medicine | Admitting: Family Medicine

## 2024-03-14 DIAGNOSIS — E28319 Asymptomatic premature menopause: Secondary | ICD-10-CM | POA: Diagnosis not present

## 2024-03-14 DIAGNOSIS — M85852 Other specified disorders of bone density and structure, left thigh: Secondary | ICD-10-CM | POA: Diagnosis not present

## 2024-03-14 DIAGNOSIS — Z780 Asymptomatic menopausal state: Secondary | ICD-10-CM | POA: Diagnosis not present

## 2024-06-02 DIAGNOSIS — E119 Type 2 diabetes mellitus without complications: Secondary | ICD-10-CM | POA: Diagnosis not present

## 2024-06-02 DIAGNOSIS — I10 Essential (primary) hypertension: Secondary | ICD-10-CM | POA: Diagnosis not present

## 2024-06-02 DIAGNOSIS — E7800 Pure hypercholesterolemia, unspecified: Secondary | ICD-10-CM | POA: Diagnosis not present

## 2024-09-04 DIAGNOSIS — E7800 Pure hypercholesterolemia, unspecified: Secondary | ICD-10-CM | POA: Diagnosis not present

## 2024-09-04 DIAGNOSIS — Z23 Encounter for immunization: Secondary | ICD-10-CM | POA: Diagnosis not present

## 2024-09-04 DIAGNOSIS — R7303 Prediabetes: Secondary | ICD-10-CM | POA: Diagnosis not present

## 2024-09-04 DIAGNOSIS — I10 Essential (primary) hypertension: Secondary | ICD-10-CM | POA: Diagnosis not present

## 2024-09-04 DIAGNOSIS — K219 Gastro-esophageal reflux disease without esophagitis: Secondary | ICD-10-CM | POA: Diagnosis not present

## 2024-10-04 DIAGNOSIS — D485 Neoplasm of uncertain behavior of skin: Secondary | ICD-10-CM | POA: Diagnosis not present

## 2024-10-04 DIAGNOSIS — L568 Other specified acute skin changes due to ultraviolet radiation: Secondary | ICD-10-CM | POA: Diagnosis not present
